# Patient Record
Sex: Male | Born: 1965 | Race: White | Hispanic: No | Marital: Married | Smoking: Never smoker
Health system: Southern US, Community
[De-identification: ages and names within clinical notes are randomized; demographics above are authoritative.]

## PROBLEM LIST (undated history)

## (undated) DIAGNOSIS — I10 Essential (primary) hypertension: Secondary | ICD-10-CM

## (undated) DIAGNOSIS — F319 Bipolar disorder, unspecified: Secondary | ICD-10-CM

## (undated) HISTORY — PX: TYMPANOSTOMY TUBE PLACEMENT: SHX32

---

## 2009-09-23 ENCOUNTER — Emergency Department (HOSPITAL_COMMUNITY): Admission: EM | Admit: 2009-09-23 | Discharge: 2009-09-23 | Payer: Self-pay | Admitting: Family Medicine

## 2010-07-23 ENCOUNTER — Encounter: Payer: Self-pay | Admitting: Gastroenterology

## 2014-01-14 ENCOUNTER — Encounter (HOSPITAL_COMMUNITY): Payer: Self-pay | Admitting: Emergency Medicine

## 2014-01-14 ENCOUNTER — Emergency Department (HOSPITAL_COMMUNITY)
Admission: EM | Admit: 2014-01-14 | Discharge: 2014-01-14 | Disposition: A | Discharge status: Home / Self Care | Payer: PRIVATE HEALTH INSURANCE | Attending: Emergency Medicine | Admitting: Emergency Medicine

## 2014-01-14 DIAGNOSIS — Y92410 Unspecified street and highway as the place of occurrence of the external cause: Secondary | ICD-10-CM | POA: Insufficient documentation

## 2014-01-14 DIAGNOSIS — S51809A Unspecified open wound of unspecified forearm, initial encounter: Secondary | ICD-10-CM | POA: Insufficient documentation

## 2014-01-14 DIAGNOSIS — Y9389 Activity, other specified: Secondary | ICD-10-CM | POA: Insufficient documentation

## 2014-01-14 DIAGNOSIS — S61409A Unspecified open wound of unspecified hand, initial encounter: Secondary | ICD-10-CM | POA: Diagnosis not present

## 2014-01-14 DIAGNOSIS — I10 Essential (primary) hypertension: Secondary | ICD-10-CM | POA: Insufficient documentation

## 2014-01-14 DIAGNOSIS — IMO0002 Reserved for concepts with insufficient information to code with codable children: Secondary | ICD-10-CM | POA: Diagnosis present

## 2014-01-14 DIAGNOSIS — Z8659 Personal history of other mental and behavioral disorders: Secondary | ICD-10-CM | POA: Diagnosis not present

## 2014-01-14 DIAGNOSIS — T07XXXA Unspecified multiple injuries, initial encounter: Secondary | ICD-10-CM

## 2014-01-14 HISTORY — DX: Bipolar disorder, unspecified: F31.9

## 2014-01-14 HISTORY — DX: Essential (primary) hypertension: I10
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
## 2014-01-15 NOTE — ED Provider Notes (Signed)
Medical screening examination/treatment/procedure(s) were performed by non-physician practitioner and as supervising physician I was immediately available for consultation/collaboration.   EKG Interpretation None       Delando Satter, MD 01/15/14 1451 

## 2019-12-24 ENCOUNTER — Emergency Department (HOSPITAL_COMMUNITY)
Admission: EM | Admit: 2019-12-24 | Discharge: 2019-12-25 | Disposition: A | Discharge status: Home / Self Care | Payer: BC Managed Care – PPO | Attending: Emergency Medicine | Admitting: Emergency Medicine

## 2019-12-24 DIAGNOSIS — R569 Unspecified convulsions: Secondary | ICD-10-CM | POA: Insufficient documentation

## 2019-12-24 DIAGNOSIS — I10 Essential (primary) hypertension: Secondary | ICD-10-CM | POA: Insufficient documentation

## 2019-12-24 MED ORDER — LORAZEPAM 2 MG/ML IJ SOLN
INTRAMUSCULAR | Status: AC
  Filled 2019-12-24: qty 1
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
## 2019-12-25 ENCOUNTER — Encounter (HOSPITAL_COMMUNITY): Payer: Self-pay | Admitting: Emergency Medicine

## 2019-12-25 ENCOUNTER — Other Ambulatory Visit: Payer: Self-pay

## 2019-12-25 ENCOUNTER — Emergency Department (HOSPITAL_COMMUNITY): Payer: BC Managed Care – PPO

## 2019-12-25 ENCOUNTER — Observation Stay (HOSPITAL_COMMUNITY)
Admission: EM | Admit: 2019-12-25 | Discharge: 2019-12-31 | Disposition: A | Discharge status: Other Acute Inpatient Hospital | Payer: BC Managed Care – PPO | Attending: Internal Medicine | Admitting: Internal Medicine

## 2019-12-25 ENCOUNTER — Ambulatory Visit (HOSPITAL_COMMUNITY)
Admission: EM | Admit: 2019-12-25 | Discharge: 2019-12-25 | Disposition: A | Discharge status: Other Acute Inpatient Hospital | Payer: BC Managed Care – PPO

## 2019-12-25 DIAGNOSIS — F309 Manic episode, unspecified: Secondary | ICD-10-CM | POA: Diagnosis not present

## 2019-12-25 DIAGNOSIS — Z20822 Contact with and (suspected) exposure to covid-19: Secondary | ICD-10-CM | POA: Diagnosis not present

## 2019-12-25 DIAGNOSIS — R569 Unspecified convulsions: Secondary | ICD-10-CM

## 2019-12-25 DIAGNOSIS — Z880 Allergy status to penicillin: Secondary | ICD-10-CM | POA: Diagnosis not present

## 2019-12-25 DIAGNOSIS — E876 Hypokalemia: Secondary | ICD-10-CM | POA: Diagnosis not present

## 2019-12-25 DIAGNOSIS — F419 Anxiety disorder, unspecified: Secondary | ICD-10-CM | POA: Diagnosis not present

## 2019-12-25 DIAGNOSIS — R52 Pain, unspecified: Secondary | ICD-10-CM

## 2019-12-25 DIAGNOSIS — Z79899 Other long term (current) drug therapy: Secondary | ICD-10-CM | POA: Insufficient documentation

## 2019-12-25 DIAGNOSIS — F445 Conversion disorder with seizures or convulsions: Secondary | ICD-10-CM | POA: Diagnosis not present

## 2019-12-25 DIAGNOSIS — Z882 Allergy status to sulfonamides status: Secondary | ICD-10-CM | POA: Diagnosis not present

## 2019-12-25 DIAGNOSIS — I119 Hypertensive heart disease without heart failure: Secondary | ICD-10-CM | POA: Diagnosis not present

## 2019-12-25 DIAGNOSIS — R55 Syncope and collapse: Principal | ICD-10-CM | POA: Diagnosis present

## 2019-12-25 DIAGNOSIS — F3112 Bipolar disorder, current episode manic without psychotic features, moderate: Secondary | ICD-10-CM | POA: Diagnosis not present

## 2019-12-25 LAB — CBC WITH DIFFERENTIAL/PLATELET
Abs Immature Granulocytes: 0.02 10*3/uL (ref 0.00–0.07)
Abs Immature Granulocytes: 0.03 10*3/uL (ref 0.00–0.07)
Basophils Absolute: 0 10*3/uL (ref 0.0–0.1)
Basophils Absolute: 0 10*3/uL (ref 0.0–0.1)
Basophils Relative: 0 %
Basophils Relative: 1 %
Eosinophils Absolute: 0 10*3/uL (ref 0.0–0.5)
Eosinophils Absolute: 0 10*3/uL (ref 0.0–0.5)
Eosinophils Relative: 0 %
Eosinophils Relative: 0 %
HCT: 41.9 % (ref 39.0–52.0)
HCT: 50.1 % (ref 39.0–52.0)
Hemoglobin: 14.1 g/dL (ref 13.0–17.0)
Hemoglobin: 16.1 g/dL (ref 13.0–17.0)
Immature Granulocytes: 0 %
Immature Granulocytes: 0 %
Lymphocytes Relative: 14 %
Lymphocytes Relative: 40 %
Lymphs Abs: 1.1 10*3/uL (ref 0.7–4.0)
Lymphs Abs: 3 10*3/uL (ref 0.7–4.0)
MCH: 30.2 pg (ref 26.0–34.0)
MCH: 30.3 pg (ref 26.0–34.0)
MCHC: 32.1 g/dL (ref 30.0–36.0)
MCHC: 33.7 g/dL (ref 30.0–36.0)
MCV: 89.7 fL (ref 80.0–100.0)
MCV: 94.2 fL (ref 80.0–100.0)
Monocytes Absolute: 0.5 10*3/uL (ref 0.1–1.0)
Monocytes Absolute: 0.6 10*3/uL (ref 0.1–1.0)
Monocytes Relative: 6 %
Monocytes Relative: 8 %
Neutro Abs: 3.9 10*3/uL (ref 1.7–7.7)
Neutro Abs: 6.2 10*3/uL (ref 1.7–7.7)
Neutrophils Relative %: 51 %
Neutrophils Relative %: 80 %
Platelets: 211 10*3/uL (ref 150–400)
Platelets: 291 10*3/uL (ref 150–400)
RBC: 4.67 MIL/uL (ref 4.22–5.81)
RBC: 5.32 MIL/uL (ref 4.22–5.81)
RDW: 13.4 % (ref 11.5–15.5)
RDW: 13.4 % (ref 11.5–15.5)
WBC: 7.6 10*3/uL (ref 4.0–10.5)
WBC: 7.9 10*3/uL (ref 4.0–10.5)
nRBC: 0 % (ref 0.0–0.2)
nRBC: 0 % (ref 0.0–0.2)

## 2019-12-25 LAB — COMPREHENSIVE METABOLIC PANEL
ALT: 22 U/L (ref 0–44)
ALT: 28 U/L (ref 0–44)
AST: 28 U/L (ref 15–41)
AST: 52 U/L — ABNORMAL HIGH (ref 15–41)
Albumin: 3.7 g/dL (ref 3.5–5.0)
Albumin: 4.4 g/dL (ref 3.5–5.0)
Alkaline Phosphatase: 40 U/L (ref 38–126)
Alkaline Phosphatase: 49 U/L (ref 38–126)
Anion gap: 12 (ref 5–15)
Anion gap: 26 — ABNORMAL HIGH (ref 5–15)
BUN: 22 mg/dL — ABNORMAL HIGH (ref 6–20)
BUN: 26 mg/dL — ABNORMAL HIGH (ref 6–20)
CO2: 10 mmol/L — ABNORMAL LOW (ref 22–32)
CO2: 22 mmol/L (ref 22–32)
Calcium: 10.3 mg/dL (ref 8.9–10.3)
Calcium: 9.2 mg/dL (ref 8.9–10.3)
Chloride: 103 mmol/L (ref 98–111)
Chloride: 108 mmol/L (ref 98–111)
Creatinine, Ser: 1.1 mg/dL (ref 0.61–1.24)
Creatinine, Ser: 1.56 mg/dL — ABNORMAL HIGH (ref 0.61–1.24)
GFR calc Af Amer: 59 mL/min — ABNORMAL LOW (ref >60)
GFR calc Af Amer: >60 mL/min (ref >60)
GFR calc non Af Amer: 51 mL/min — ABNORMAL LOW (ref >60)
GFR calc non Af Amer: >60 mL/min (ref >60)
Glucose, Bld: 112 mg/dL — ABNORMAL HIGH (ref 70–99)
Glucose, Bld: 164 mg/dL — ABNORMAL HIGH (ref 70–99)
Potassium: 3.4 mmol/L — ABNORMAL LOW (ref 3.5–5.1)
Potassium: 3.9 mmol/L (ref 3.5–5.1)
Sodium: 137 mmol/L (ref 135–145)
Sodium: 144 mmol/L (ref 135–145)
Total Bilirubin: 0.8 mg/dL (ref 0.3–1.2)
Total Bilirubin: 1.4 mg/dL — ABNORMAL HIGH (ref 0.3–1.2)
Total Protein: 6.2 g/dL — ABNORMAL LOW (ref 6.5–8.1)
Total Protein: 7.1 g/dL (ref 6.5–8.1)

## 2019-12-25 LAB — RAPID URINE DRUG SCREEN, HOSP PERFORMED
Amphetamines: NOT DETECTED
Barbiturates: NOT DETECTED
Benzodiazepines: NOT DETECTED
Cocaine: NOT DETECTED
Opiates: NOT DETECTED
Tetrahydrocannabinol: NOT DETECTED

## 2019-12-25 LAB — BASIC METABOLIC PANEL
Anion gap: 12 (ref 5–15)
BUN: 20 mg/dL (ref 6–20)
CO2: 21 mmol/L — ABNORMAL LOW (ref 22–32)
Calcium: 9.4 mg/dL (ref 8.9–10.3)
Chloride: 105 mmol/L (ref 98–111)
Creatinine, Ser: 1.12 mg/dL (ref 0.61–1.24)
GFR calc Af Amer: >60 mL/min (ref >60)
GFR calc non Af Amer: >60 mL/min (ref >60)
Glucose, Bld: 110 mg/dL — ABNORMAL HIGH (ref 70–99)
Potassium: 3.3 mmol/L — ABNORMAL LOW (ref 3.5–5.1)
Sodium: 138 mmol/L (ref 135–145)

## 2019-12-25 LAB — VALPROIC ACID LEVEL
Valproic Acid Lvl: 59 ug/mL (ref 50.0–100.0)
Valproic Acid Lvl: 95 ug/mL (ref 50.0–100.0)

## 2019-12-25 LAB — AMMONIA: Ammonia: 15 umol/L (ref 9–35)

## 2019-12-25 LAB — MAGNESIUM: Magnesium: 2.1 mg/dL (ref 1.7–2.4)

## 2019-12-25 LAB — CBG MONITORING, ED: Glucose-Capillary: 104 mg/dL — ABNORMAL HIGH (ref 70–99)

## 2019-12-25 LAB — ETHANOL: Alcohol, Ethyl (B): <10 mg/dL (ref <10)

## 2019-12-25 LAB — SARS CORONAVIRUS 2 BY RT PCR (HOSPITAL ORDER, PERFORMED IN ~~LOC~~ HOSPITAL LAB): SARS Coronavirus 2: NEGATIVE

## 2019-12-25 MED ORDER — LIDOCAINE VISCOUS HCL 2 % MT SOLN
15.0000 mL | Freq: Once | OROMUCOSAL | Status: DC
  Filled 2019-12-25: qty 15

## 2019-12-25 MED ORDER — POTASSIUM CHLORIDE CRYS ER 20 MEQ PO TBCR
40.0000 meq | EXTENDED_RELEASE_TABLET | Freq: Once | ORAL | Status: AC
  Administered 2019-12-25: 40 meq via ORAL
  Filled 2019-12-25: qty 2

## 2019-12-25 MED ORDER — LABETALOL HCL 5 MG/ML IV SOLN: 10.0000 mg | Freq: Four times a day (QID) | INTRAVENOUS | Status: DC | PRN

## 2019-12-25 MED ORDER — ESCITALOPRAM OXALATE 10 MG PO TABS
10.0000 mg | ORAL_TABLET | Freq: Every day | ORAL | Status: DC
  Administered 2019-12-26 – 2019-12-31 (×6): 10 mg via ORAL
  Filled 2019-12-25 (×6): qty 1

## 2019-12-25 MED ORDER — DIVALPROEX SODIUM ER 500 MG PO TB24
1000.0000 mg | ORAL_TABLET | Freq: Every morning | ORAL | Status: DC
  Administered 2019-12-26 – 2019-12-31 (×6): 1000 mg via ORAL
  Filled 2019-12-25 (×6): qty 2

## 2019-12-25 MED ORDER — ENOXAPARIN SODIUM 40 MG/0.4ML ~~LOC~~ SOLN
40.0000 mg | SUBCUTANEOUS | Status: DC
  Administered 2019-12-25 – 2019-12-27 (×3): 40 mg via SUBCUTANEOUS
  Filled 2019-12-25 (×5): qty 0.4

## 2019-12-25 MED ORDER — ACETAMINOPHEN 325 MG PO TABS: 650.0000 mg | ORAL_TABLET | Freq: Four times a day (QID) | ORAL | Status: DC | PRN

## 2019-12-25 MED ORDER — ACETAMINOPHEN 650 MG RE SUPP: 650.0000 mg | Freq: Four times a day (QID) | RECTAL | Status: DC | PRN

## 2019-12-25 MED ORDER — LABETALOL HCL 5 MG/ML IV SOLN
5.0000 mg | Freq: Once | INTRAVENOUS | Status: AC
  Administered 2019-12-25: 5 mg via INTRAVENOUS
  Filled 2019-12-25: qty 4

## 2019-12-25 NOTE — ED Notes (Signed)
Pt requesting oral Lidocaine & PRN Melatonin. Will page MD for same.

## 2019-12-25 NOTE — ED Notes (Signed)
Pt coming by EMS after having seizure this evening that was witnessed by wife. EMS stated pt is bipolar and is on Depakote. Wife noticed he was having manic behavior and asked if he had been taking his meds. Pt told her he had been out of Depakote for about 3 weeks. Wife took him on the 23rd to have prescription filled and pt took one dose this AM. Pt hit head when he fell from seizure

## 2019-12-25 NOTE — Procedures (Signed)
Patient Name: Jon Ramos  MRN: [id number]  Epilepsy Attending: Charlsie Quest  Referring Physician/Provider: Dr. Georgiana Spinner Aroor Date: 6/25/121 Duration: 23.56 mins  Patient history: 54 year old male with seizure-like episodes.  EEG to assess for seizures.  Level of alertness: awake  AEDs during EEG study: None  Technical aspects: This EEG study was done with scalp electrodes positioned according to the 10-20 International system of electrode placement. Electrical activity was acquired at a sampling rate of 500Hz  and reviewed with a high frequency filter of 70Hz  and a low frequency filter of 1Hz . EEG data were recorded continuously and digitally stored.   Description: The posterior dominant rhythm consists of 8-9 Hz activity of moderate voltage (25-35 uV) seen predominantly in posterior head regions, symmetric and reactive to eye opening and eye closing. Hyperventilation and photic stimulation were not performed.     IMPRESSION: This study is within normal limits. No seizures or epileptiform discharges were seen throughout the recording.  Cono Gebhard 

## 2019-12-25 NOTE — ED Notes (Signed)
MD Maisie Fus returned page, to check pt's chart & order appropriate PRN medications for pt

## 2019-12-25 NOTE — ED Notes (Signed)
Pt voiced concern regarding lovenox shot, wanting to know side effects/adverse effects. This RN explained & listed all known side effects & adverse effects for pt to pt's satisfaction. Pt then requested that lovenox shot not be given for remainder of hospital stay.
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
## 2019-12-25 NOTE — ED Notes (Signed)
Sedrick Tober wife [phone] looking for an update on the pt
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
## 2019-12-25 NOTE — Progress Notes (Signed)
EEG complete - results pending 

## 2019-12-25 NOTE — Discharge Instructions (Signed)
Your depakote level was normal today.  Continue your normal dosing. We recommend you refrain from driving until you are seen by neurologist. I have placed a referral to neurology-- if you do not hear from them in a few days, follow-up regarding appointment. Return here for any new/acute changes.
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
## 2019-12-25 NOTE — ED Notes (Signed)
Patients belongings in locker #31
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
## 2019-12-25 NOTE — ED Notes (Signed)
Patient verbalized understanding of dc instructions, vss, pt taken out via wheelchair. pts wife contacted to pick pt up.
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
## 2019-12-25 NOTE — ED Notes (Signed)
Patient exited assessment room 125. Patient knocked on Continuous assessment room looking for MHT entered hallway to comfort Pt. Patient began to speak about wife being in lobby and needing redirection. Patient began muttering, MHT approached pt. Pt. Hit ground shoulders parallel to floor, no head contact to floor. Patient immediately began seizing, was unresponsive, began to stop breating. MHT hit the panic button and staff arrived at the scene with oxygen and Code cart within 45 seconds.

## 2019-12-25 NOTE — ED Notes (Signed)
Paged admitting to RN per her request about pt requesting some meds

## 2019-12-25 NOTE — ED Triage Notes (Signed)
Patient brought in by [facility] for unwitnessed seizure at [facility] urgent care, patient found on floor post-ictal and with mouth trauma. Patient currently speaking rapidly and changing topics rapidly. Patient alert, oriented, and in no distress at this time.

## 2019-12-25 NOTE — ED Notes (Signed)
Patient took off gown and leads refuses to wear gown or leads.

## 2019-12-26 ENCOUNTER — Observation Stay (HOSPITAL_BASED_OUTPATIENT_CLINIC_OR_DEPARTMENT_OTHER): Payer: BC Managed Care – PPO

## 2019-12-26 DIAGNOSIS — F3112 Bipolar disorder, current episode manic without psychotic features, moderate: Secondary | ICD-10-CM | POA: Diagnosis not present

## 2019-12-26 DIAGNOSIS — R55 Syncope and collapse: Secondary | ICD-10-CM

## 2019-12-26 LAB — CBC
HCT: 41.3 % (ref 39.0–52.0)
Hemoglobin: 13.8 g/dL (ref 13.0–17.0)
MCH: 30.1 pg (ref 26.0–34.0)
MCHC: 33.4 g/dL (ref 30.0–36.0)
MCV: 90 fL (ref 80.0–100.0)
Platelets: 181 10*3/uL (ref 150–400)
RBC: 4.59 MIL/uL (ref 4.22–5.81)
RDW: 13.4 % (ref 11.5–15.5)
WBC: 6.2 10*3/uL (ref 4.0–10.5)
nRBC: 0 % (ref 0.0–0.2)

## 2019-12-26 LAB — COMPREHENSIVE METABOLIC PANEL
ALT: 33 U/L (ref 0–44)
AST: 98 U/L — ABNORMAL HIGH (ref 15–41)
Albumin: 3.8 g/dL (ref 3.5–5.0)
Alkaline Phosphatase: 40 U/L (ref 38–126)
Anion gap: 14 (ref 5–15)
BUN: 14 mg/dL (ref 6–20)
CO2: 22 mmol/L (ref 22–32)
Calcium: 9.3 mg/dL (ref 8.9–10.3)
Chloride: 103 mmol/L (ref 98–111)
Creatinine, Ser: 1.13 mg/dL (ref 0.61–1.24)
GFR calc Af Amer: >60 mL/min (ref >60)
GFR calc non Af Amer: >60 mL/min (ref >60)
Glucose, Bld: 85 mg/dL (ref 70–99)
Potassium: 3.5 mmol/L (ref 3.5–5.1)
Sodium: 139 mmol/L (ref 135–145)
Total Bilirubin: 1.5 mg/dL — ABNORMAL HIGH (ref 0.3–1.2)
Total Protein: 6.4 g/dL — ABNORMAL LOW (ref 6.5–8.1)

## 2019-12-26 LAB — HIV ANTIBODY (ROUTINE TESTING W REFLEX): HIV Screen 4th Generation wRfx: NONREACTIVE

## 2019-12-26 LAB — ECHOCARDIOGRAM COMPLETE
Height: 69 in
Weight: 3460.34 oz

## 2019-12-26 NOTE — Progress Notes (Signed)
  Echocardiogram 2D Echocardiogram has been performed.  Gerda Diss 12/26/2019, 2:43 PM

## 2019-12-26 NOTE — Progress Notes (Signed)
Pt's wife Dois Davenport called expressing concerns with husband returning home. Dois Davenport states that pt blames her for stress which triggers seizure-like episodes. Wife also states that pt's work is  another trigger and admits her concerns regarding discharging home as a safe option for the patient at this time. Wife inquiring about possibilities of inpatient psychiatric therapy. This RN stated that I would forward her concerns to the providers and ask them to contact her directly.   Dois Davenport    Home: [phone]   Cell: [phone]
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
## 2019-12-26 NOTE — Consult Note (Signed)
[facility] Face-to-Face Psychiatry Consult   Reason for Consult:  Mania  Referring Physician:  Dr Macky Lower Patient Identification: Jon Ramos MRN:  [id number] Principal Diagnosis: Syncope and collapse Diagnosis:  Principal Problem:   Syncope and collapse Active Problems:   Bipolar 1 disorder with moderate mania (HCC)  Total Time spent with patient: 1 hour  Subjective:   Jon Ramos is a 54 y.o. male patient admitted with pseudoseizures.  Patient seen and evaluated in person.  He was lying on his bed with constant hand movements in an anxious manner, leg shaking in anxious manner.  Reports he has not been sleeping well which he contributes to his recent issues along with stress within his marriage.  His wife reports he has increased stress at work as well.  Client rambles on assessment with some tangential thought processes.  Perseverates about his in-laws and their health issues as a moved in with him about a year ago.  He reports he has never had seizures before a ~last couple of days which she contributes to stress.  Notes report that he blames his wife.  He initially went to the emergency department and was cleared from his first seizure.  Then his wife took him to the [facility] for psychiatric evaluation.  He appeared to be having a seizure at the facility and was sent to the ER.  He was admitted for evaluation and cleared by neurology.  Discussed clients symptoms the hypomania, inability to sleep, increased stressors, and other concerns triggering his recent seizure activities.  Explained to the patient that his wife was concerned about him along with this provider.  Discussed the need to go to psychiatric inpatient hospitalist to continue to monitor his progress as he is reporting sleep improvement last night.  Emphasized the need for him to process his stress in a healthy way to prevent seizures the potentially could cause head injuries or other physical trauma.  Also explained to him that his  medications need to be monitored as he has not been on Depakote for "weeks".  He did not seem to be favorable but no objections except he wanted to speak to his wife.  Tangential with pressured speech at times and obviously anxious with outward signs.  HPI per MD:   Patient was in the ed 12/24/19 for seizure that was witnessed by his wife. Patient in on depakote for bipolar one. He stated he had been off of depakote for 3 weeks, but just yesterday (6/24) he started back taking his depakote. Patient wad discharged to go to behavioral health for evaluation. While at [facility] he had an unwitnessed seizure. He was found on the floor post-ictal with a tongue bite. Per patient he has been having  Several "small seizures" recently. He can tell when he is about to have one.  These usually happen when he gets agitated and stressed. He gets really emotional and tearful, he will start shaking his arms, but he is able to stop a full blown seizure from happening by walking away and taking deep breaths. This way it does not progress to where he passes out. He does not remember the two episodes where he passed out. He said he went to [facility] was doing intake paper work and then woke up in an ambulance. Attempted to call wife  For description of the episode but she did not answer.  Patient states his mind has been really busy and he has been having trouble/ not sleeping for the past 8 days. Denies drug  use. Endorses smokeless tobacco and drinking 2 beers per day.  Past Psychiatric History: bipolar d/o  Risk to Self:  yes Risk to Others:  none Prior Inpatient Therapy:  yes Prior Outpatient Therapy:  yes  Past Medical History:  Past Medical History:  Diagnosis Date  . Bipolar 1 disorder ([place])   . Hypertension    History reviewed. No pertinent surgical history. Family History: No family history on file. Family Psychiatric  History: none Social History:  Social History   Substance and Sexual Activity  Alcohol Use No      Social History   Substance and Sexual Activity  Drug Use No    Social History   Socioeconomic History  . Marital status: Married    Spouse name: Not on file  . Number of children: Not on file  . Years of education: Not on file  . Highest education level: Not on file  Occupational History  . Not on file  Tobacco Use  . Smoking status: Never Smoker  Substance and Sexual Activity  . Alcohol use: No  . Drug use: No  . Sexual activity: Not on file  Other Topics Concern  . Not on file  Social History Narrative  . Not on file   Social Determinants of Health   Financial Resource Strain:   . Difficulty of Paying Living Expenses:   Food Insecurity:   . Worried About Charity fundraiser in the Last Year:   . Arboriculturist in the Last Year:   Transportation Needs:   . Film/video editor (Medical):   Marland Kitchen Lack of Transportation (Non-Medical):   Physical Activity:   . Days of Exercise per Week:   . Minutes of Exercise per Session:   Stress:   . Feeling of Stress :   Social Connections:   . Frequency of Communication with Friends and Family:   . Frequency of Social Gatherings with Friends and Family:   . Attends Religious Services:   . Active Member of Clubs or Organizations:   . Attends Archivist Meetings:   Marland Kitchen Marital Status:    Additional Social History:    Allergies:   Allergies  Allergen Reactions  . Penicillins Hives  . Sulfa Antibiotics Hives    Labs:  Results for orders placed or performed during the hospital encounter of 12/25/19 (from the past 48 hour(s))  Basic metabolic panel     Status: Abnormal   Collection Time: 12/25/19  1:49 PM  Result Value Ref Range   Sodium 138 135 - 145 mmol/L   Potassium 3.3 (L) 3.5 - 5.1 mmol/L   Chloride 105 98 - 111 mmol/L   CO2 21 (L) 22 - 32 mmol/L   Glucose, Bld 110 (H) 70 - 99 mg/dL    Comment: Glucose reference range applies only to samples taken after fasting for at least 8 hours.   BUN 20 6 - 20  mg/dL   Creatinine, Ser 1.12 0.61 - 1.24 mg/dL   Calcium 9.4 8.9 - 10.3 mg/dL   GFR calc non Af Amer >60 >60 mL/min   GFR calc Af Amer >60 >60 mL/min   Anion gap 12 5 - 15    Comment: Performed at [facility] [address], [place], [place] [address]  CBC WITH DIFFERENTIAL     Status: None   Collection Time: 12/25/19  1:49 PM  Result Value Ref Range   WBC 7.9 4.0 - 10.5 K/uL   RBC 4.67  4.22 - 5.81 MIL/uL   Hemoglobin 14.1 13.0 - 17.0 g/dL   HCT 67.8 39 - 52 %   MCV 89.7 80.0 - 100.0 fL   MCH 30.2 26.0 - 34.0 pg   MCHC 33.7 30.0 - 36.0 g/dL   RDW 93.8 10.1 - 75.1 %   Platelets 211 150 - 400 K/uL   nRBC 0.0 0.0 - 0.2 %   Neutrophils Relative % 80 %   Neutro Abs 6.2 1.7 - 7.7 K/uL   Lymphocytes Relative 14 %   Lymphs Abs 1.1 0.7 - 4.0 K/uL   Monocytes Relative 6 %   Monocytes Absolute 0.5 0 - 1 K/uL   Eosinophils Relative 0 %   Eosinophils Absolute 0.0 0 - 0 K/uL   Basophils Relative 0 %   Basophils Absolute 0.0 0 - 0 K/uL   Immature Granulocytes 0 %   Abs Immature Granulocytes 0.03 0.00 - 0.07 K/uL    Comment: Performed at [facility], [address] [place], [place] [address]  Valproic acid level     Status: None   Collection Time: 12/25/19  1:49 PM  Result Value Ref Range   Valproic Acid Lvl 95 50.0 - 100.0 ug/mL    Comment: Performed at [facility], [address] [place], [place] [address]  Magnesium     Status: None   Collection Time: 12/25/19  1:49 PM  Result Value Ref Range   Magnesium 2.1 1.7 - 2.4 mg/dL    Comment: Performed at [facility], [address], [place], [place] [address]  CBG monitoring, ED     Status: Abnormal   Collection Time: 12/25/19  2:22 PM  Result Value Ref Range   Glucose-Capillary 104 (H) 70 - 99 mg/dL    Comment: Glucose reference range applies only to samples taken after fasting for at least 8 hours.   Comment 1 Notify RN    Comment 2 Document in Chart   Ammonia     Status: None   Collection Time:  12/25/19  6:17 PM  Result Value Ref Range   Ammonia 15 9 - 35 umol/L    Comment: Performed at [facility], [address] [place], [place] [address]  SARS Coronavirus 2 by RT PCR (hospital order, performed in [facility] hospital lab) Nasopharyngeal Nasopharyngeal Swab     Status: None   Collection Time: 12/25/19  8:52 PM   Specimen: Nasopharyngeal Swab  Result Value Ref Range   SARS Coronavirus 2 NEGATIVE NEGATIVE    Comment: (NOTE) SARS-CoV-2 target nucleic acids are NOT DETECTED.  The SARS-CoV-2 RNA is generally detectable in upper and lower respiratory specimens during the acute phase of infection. The lowest concentration of SARS-CoV-2 viral copies this assay can detect is 250 copies / mL. A negative result does not preclude SARS-CoV-2 infection and should not be used as the sole basis for treatment or other patient management decisions.  A negative result may occur with improper specimen collection / handling, submission of specimen other than nasopharyngeal swab, presence of viral mutation(s) within the areas targeted by this assay, and inadequate number of viral copies (<250 copies / mL). A negative result must be combined with clinical observations, patient history, and epidemiological information.  Fact Sheet for Patients:   BoilerBrush.com.cy  Fact Sheet for Healthcare Providers: https://pope.com/  This test is not yet approved or  cleared by the [place] FDA and has been authorized for detection and/or diagnosis of SARS-CoV-2  by FDA under an Emergency Use Authorization (EUA).  This EUA will remain in effect (meaning this test can be used) for the duration of the COVID-19 declaration under Section 564(b)(1) of the Act, 21 U.S.C. section 360bbb-3(b)(1), unless the authorization is terminated or revoked sooner.  Performed at [facility], [address] [place], [place] [address]   Comprehensive  metabolic panel     Status: Abnormal   Collection Time: 12/26/19  4:56 AM  Result Value Ref Range   Sodium 139 135 - 145 mmol/L   Potassium 3.5 3.5 - 5.1 mmol/L   Chloride 103 98 - 111 mmol/L   CO2 22 22 - 32 mmol/L   Glucose, Bld 85 70 - 99 mg/dL    Comment: Glucose reference range applies only to samples taken after fasting for at least 8 hours.   BUN 14 6 - 20 mg/dL   Creatinine, Ser 4.33 0.61 - 1.24 mg/dL   Calcium 9.3 8.9 - 29.5 mg/dL   Total Protein 6.4 (L) 6.5 - 8.1 g/dL   Albumin 3.8 3.5 - 5.0 g/dL   AST 98 (H) 15 - 41 U/L   ALT 33 0 - 44 U/L   Alkaline Phosphatase 40 38 - 126 U/L   Total Bilirubin 1.5 (H) 0.3 - 1.2 mg/dL   GFR calc non Af Amer >60 >60 mL/min   GFR calc Af Amer >60 >60 mL/min   Anion gap 14 5 - 15    Comment: Performed at [facility], [address], [place], [place] [address]  CBC     Status: None   Collection Time: 12/26/19  4:56 AM  Result Value Ref Range   WBC 6.2 4.0 - 10.5 K/uL   RBC 4.59 4.22 - 5.81 MIL/uL   Hemoglobin 13.8 13.0 - 17.0 g/dL   HCT 66.0 39 - 52 %   MCV 90.0 80.0 - 100.0 fL   MCH 30.1 26.0 - 34.0 pg   MCHC 33.4 30.0 - 36.0 g/dL   RDW 63.0 16.0 - 10.9 %   Platelets 181 150 - 400 K/uL   nRBC 0.0 0.0 - 0.2 %    Comment: Performed at [facility], [address], [place], [place] [address]  HIV Antibody (routine testing w rflx)     Status: None   Collection Time: 12/26/19  4:56 AM  Result Value Ref Range   HIV Screen 4th Generation wRfx Non Reactive Non Reactive    Comment: Performed at [facility], [address], [place], [place] [address]    Current Facility-Administered Medications  Medication Dose Route Frequency Provider Last Rate Last Admin  . acetaminophen (TYLENOL) tablet 650 mg  650 mg Oral Q6H PRN Matcha, Anupama, MD       Or  . acetaminophen (TYLENOL) suppository 650 mg  650 mg Rectal Q6H PRN Matcha, Anupama, MD      . divalproex (DEPAKOTE ER) 24 hr tablet 1,000 mg  1,000 mg Oral q morning - 10a  Matcha, Anupama, MD   1,000 mg at 12/26/19 1003  . enoxaparin (LOVENOX) injection 40 mg  40 mg Subcutaneous Q24H Matcha, Anupama, MD   40 mg at 12/25/19 2037  . escitalopram (LEXAPRO) tablet 10 mg  10 mg Oral Daily Matcha, Anupama, MD   10 mg at 12/26/19 1004  . labetalol (NORMODYNE) injection 10 mg  10 mg Intravenous Q6H PRN Matcha, Anupama, MD      . lidocaine (XYLOCAINE) 2 % viscous mouth  solution 15 mL  15 mL Mouth/Throat Once Lurline Delhomas, Sara-Maiz A, MD        Musculoskeletal: Strength & Muscle Tone: increased Gait & Station: normal Patient leans: N/A  Psychiatric Specialty Exam: Physical Exam Vitals and nursing note reviewed.  Constitutional:      Appearance: Normal appearance.  HENT:     Head: Normocephalic.     Nose: Nose normal.  Pulmonary:     Effort: Pulmonary effort is normal.  Musculoskeletal:        General: Normal range of motion.  Neurological:     General: No focal deficit present.     Mental Status: He is alert and oriented to person, place, and time.  Psychiatric:        Attention and Perception: He is inattentive.        Mood and Affect: Mood is anxious.        Speech: Speech is tangential.        Behavior: Behavior is cooperative.        Thought Content: Thought content is paranoid and delusional.        Cognition and Memory: He exhibits impaired recent memory.        Judgment: Judgment is impulsive.     Review of Systems  Psychiatric/Behavioral: Positive for sleep disturbance. The patient is nervous/anxious and is hyperactive.   All other systems reviewed and are negative.   Blood pressure (!) 145/89, pulse 86, temperature 98.4 F (36.9 C), resp. rate 18, height 5\' 9"  (1.753 m), weight 98.1 kg, SpO2 99 %.Body mass index is 31.94 kg/m.  General Appearance: Casual  Eye Contact:  Fair  Speech:  Pressured at times  Volume:  Normal  Mood:  Anxious  Affect:  Congruent  Thought Process:  Descriptions of Associations: Tangential at times  Orientation:   Full (Time, Place, and Person)  Thought Content:  Delusions and Paranoid Ideation  Suicidal Thoughts:  No  Homicidal Thoughts:  No  Memory:  Immediate;   Fair Recent;   Fair Remote;   Fair  Judgement:  Impaired  Insight:  Lacking  Psychomotor Activity:  Increased  Concentration:  Concentration: Poor and Attention Span: Poor  Recall:  FiservFair  Fund of Knowledge:  Fair  Language:  Good  Akathisia:  No  Handed:  Left  AIMS (if indicated):     Assets:  Housing Leisure Time Physical Health Resilience Social Support Vocational/Educational  ADL's:  Intact  Cognition:  Impaired,  Mild  Sleep:        Treatment Plan Summary: Bipolar affective disorder, mania, moderate: -Recommend Depakote 1000 mg Daily to BID  [facility]  Depression: -continue Lexapro 10 mg daily  Disposition: Recommend psychiatric Inpatient admission when medically cleared.  Nanine MeansJamison Kyria Bumgardner, NP 12/26/2019 2:49 PM

## 2019-12-26 NOTE — Progress Notes (Signed)
Carotid artery duplex has been completed. Preliminary results can be found in CV Proc through chart review.   12/26/19 11:03 AM Olen Cordial RVT

## 2019-12-26 NOTE — Progress Notes (Signed)
Arrived in room shortly after patient arrived from the emergency department to find patient alone and walking around room.  This RN then advised the patient not to walk around unassisted due to him having possibly had seizures, causing him to be admitted to the hospital.  The patient insisted there is nothing wrong with him despite what the doctors say or what events occurred prior to admission.  Despite several attempts to encourage him to adhere to safety plan, the patient stated he was not going to.  The patient was found to be oriented to self, time, place, and situation.  He was left in bed with call bell and phone in reach; bed alarm is on.

## 2019-12-27 ENCOUNTER — Observation Stay (HOSPITAL_COMMUNITY): Payer: BC Managed Care – PPO

## 2019-12-27 DIAGNOSIS — F3112 Bipolar disorder, current episode manic without psychotic features, moderate: Secondary | ICD-10-CM | POA: Diagnosis not present

## 2019-12-27 LAB — CBC
HCT: 41 % (ref 39.0–52.0)
Hemoglobin: 13.7 g/dL (ref 13.0–17.0)
MCH: 29.8 pg (ref 26.0–34.0)
MCHC: 33.4 g/dL (ref 30.0–36.0)
MCV: 89.1 fL (ref 80.0–100.0)
Platelets: 179 10*3/uL (ref 150–400)
RBC: 4.6 MIL/uL (ref 4.22–5.81)
RDW: 13.4 % (ref 11.5–15.5)
WBC: 4.6 10*3/uL (ref 4.0–10.5)
nRBC: 0 % (ref 0.0–0.2)

## 2019-12-27 LAB — BASIC METABOLIC PANEL
Anion gap: 12 (ref 5–15)
BUN: 15 mg/dL (ref 6–20)
CO2: 22 mmol/L (ref 22–32)
Calcium: 9 mg/dL (ref 8.9–10.3)
Chloride: 102 mmol/L (ref 98–111)
Creatinine, Ser: 1.05 mg/dL (ref 0.61–1.24)
GFR calc Af Amer: >60 mL/min (ref >60)
GFR calc non Af Amer: >60 mL/min (ref >60)
Glucose, Bld: 90 mg/dL (ref 70–99)
Potassium: 3.6 mmol/L (ref 3.5–5.1)
Sodium: 136 mmol/L (ref 135–145)

## 2019-12-27 MED ORDER — POTASSIUM CHLORIDE CRYS ER 20 MEQ PO TBCR
20.0000 meq | EXTENDED_RELEASE_TABLET | Freq: Once | ORAL | Status: AC
  Administered 2019-12-27: 20 meq via ORAL
  Filled 2019-12-27: qty 1

## 2019-12-27 MED ORDER — AMLODIPINE BESYLATE 5 MG PO TABS
5.0000 mg | ORAL_TABLET | Freq: Every day | ORAL | Status: DC
  Administered 2019-12-27 – 2019-12-29 (×3): 5 mg via ORAL
  Filled 2019-12-27 (×3): qty 1

## 2019-12-27 NOTE — Progress Notes (Signed)
PROGRESS NOTE    Jon Ramos  WUJ:[id number] DOB: 1965/10/08 DOA: 12/25/2019 PCP: Patient, No Pcp Per    Chief Complaint  Patient presents with  . Seizures    Brief Narrative:  Jon Ramos is a 54 y.o. male with medical history significant of bipolar disorder and hypertension presented with complaints of syncopal episode that occurred on the day of admission at 11.30 in morning.   Patient apparently was at his behavioral health psychiatrist office the morning of admission when the syncopal/seizure episode happened. Per the note of his psychiatrist it appears that patient was having convulsive-like activity laying on the ground with both arms in the air. He has no documented history of pseudoseizures or seizures except yesterday when he had a similar episode and was brought to the ER.  He was evaluated in the ED the day prior and discharged with outpatient follow-up.  Sent to ED again because of the second episode.He denies any prodrome-like symptoms, fevers, chills, chest pain, shortness of breath, nausea, lightheadedness, dizziness. He denies any bowel or bladder incontinence and states he did not feel confused. He denies any headache, vision changes. Denies any focal weakness or numbness.  On further conversation with the patient he says that when he gets emotionally upset he has these episodes.   Assessment & Plan:   Principal Problem:   Syncope and collapse Active Problems:   Bipolar 1 disorder with moderate mania (HCC)  Syncope: -Exact etiology unclear at this time.  There is also some concern for seizures psychogenic nonepileptic spells versus seizure.  Neurology was consulted.  EEG did not show any evidence of seizures. -Orthostatic vitals negative. -2D echo and carotid Doppler to complete the work-up.  Carotid Doppler showing 1-70% stenosis bilaterally without any other acute findings. -EEG did not show seizure activity. -Episodes may be likely related to his  underlying bipolar/psych issues  Bipolar disorder: -Patient noted to have this episode at the psychiatrist office -Per neurology possibility of psychogenic nonepileptic spells. -Consulted psychiatry -Ammonia level normal -Continue home medications.  He previously was off Depakote and now restarted again. On Depakote and Lexapro. - Per Psychiatry needs inpatient psych.  Hypertension: -He is currently not taking any medications for his blood pressure. -Start him on Norvasc.  Continue to monitor blood pressure closely and adjust medications as needed.  Hypokalemia: -Potassium replacement ordered  Right knee pain/swelling: -Patient complaining of right knee swelling.  He says when he had 1 of these "seizure" episodes he probably fell on his knee and now having worsening swelling.  On examination he does not have any erythema, open lesions but does have some effusion. Ordered knee x-ray.   DVT prophylaxis: Subcutaneous Lovenox Code Status: Full Family Communication: Discussed with wife over the phone Disposition:   Status is: Observation for now.  Patient has bipolar with mania.  Since psychiatry recommending inpatient psych admission we may need to consider switching to inpatient.  Dispo: The patient is from: Home              Anticipated d/c is to: Unknown at this time              Patient currently is not medically stable to d/c.    Consultants:   Psychiatry  Procedures:   None   Antimicrobials:   None    Subjective: Blood pressure continues to remain high.  He is complaining of right knee pain and swelling.  Objective: Vitals:   12/26/19 9562 12/26/19 1601 12/26/19 1956 12/27/19 1308  BP: (!) 145/89 (!) 148/99 (!) 145/93 (!) 150/99  Pulse: 86 87  82  Resp: Temp: 98.4 F (36.9 C) 98.6 F (37 C) 98.3 F (36.8 C)   TempSrc:   Oral   SpO2: 99% 99% 100% 98%  Weight:      Height:       No intake or output data in the 24 hours ending 12/27/19  0928 Filed Weights   12/25/19 2340  Weight: 98.1 kg    Examination:  General exam: Appears calm and comfortable  Respiratory system: Clear to auscultation. Respiratory effort normal. Cardiovascular system: S1 & S2 heard, RRR. No murmur. No pedal edema. Gastrointestinal system: Abdomen is nondistended, soft and nontender. No masses felt. Normal bowel sounds heard. Central nervous system: Alert and oriented. No focal neurological deficits. Extremities: Has some swelling, effusion to the right knee, no erythema or tenderness. 5 x 5 power. Skin: No rashes, lesions or ulcers Psychiatry: Bipolar.    Data Reviewed: I have personally reviewed following labs and imaging studies  CBC: Recent Labs  Lab 12/24/19 2340 12/25/19 1349 12/26/19 0456 12/27/19 0243  WBC 7.6 7.9 6.2 4.6  NEUTROABS 3.9 6.2  --   --   HGB 16.1 14.1 13.8 13.7  HCT 50.1 41.9 41.3 41.0  MCV 94.2 89.7 90.0 89.1  PLT 291 211 181 179    Basic Metabolic Panel: Recent Labs  Lab 12/24/19 2340 12/25/19 0428 12/25/19 1349 12/26/19 0456 12/27/19 0243  NA 144 137 138 139 136  K 3.9 3.4* 3.3* 3.5 3.6  CL 108 103 105 103 102  CO2 10* 22 21* 22 22  GLUCOSE 164* 112* 110* 85 90  BUN 26* 22* CREATININE 1.56* 1.10 1.12 1.13 1.05  CALCIUM 10.3 9.2 9.4 9.3 9.0  MG  --   --  2.1  --   --     GFR: Estimated Creatinine Clearance: 96.2 mL/min (by C-G formula based on SCr of 1.05 mg/dL).  Liver Function Tests: Recent Labs  Lab 12/24/19 2340 12/25/19 0428 12/26/19 0456  AST 52* 28 98*  ALT 28 22 33  ALKPHOS 49 40 40  BILITOT 1.4* 0.8 1.5*  PROT 7.1 6.2* 6.4*  ALBUMIN 4.4 3.7 3.8    CBG: Recent Labs  Lab 12/25/19 1422  GLUCAP 104*     Recent Results (from the past 240 hour(s))  SARS Coronavirus 2 by RT PCR (hospital order, performed in [facility] hospital lab) Nasopharyngeal Nasopharyngeal Swab     Status: None   Collection Time: 12/25/19  8:52 PM   Specimen: Nasopharyngeal Swab  Result  Value Ref Range Status   SARS Coronavirus 2 NEGATIVE NEGATIVE Final    Comment: (NOTE) SARS-CoV-2 target nucleic acids are NOT DETECTED.  The SARS-CoV-2 RNA is generally detectable in upper and lower respiratory specimens during the acute phase of infection. The lowest concentration of SARS-CoV-2 viral copies this assay can detect is 250 copies / mL. A negative result does not preclude SARS-CoV-2 infection and should not be used as the sole basis for treatment or other patient management decisions.  A negative result may occur with improper specimen collection / handling, submission of specimen other than nasopharyngeal swab, presence of viral mutation(s) within the areas targeted by this assay, and inadequate number of viral copies (<250 copies / mL). A negative result must be combined with clinical observations, patient history, and epidemiological information.  Fact Sheet for Patients:   BoilerBrush.com.cy  Fact Sheet  for Healthcare Providers: https://pope.com/  This test is not yet approved or  cleared by the [place] and has been authorized for detection and/or diagnosis of SARS-CoV-2 by FDA under an Emergency Use Authorization (EUA).  This EUA will remain in effect (meaning this test can be used) for the duration of the COVID-19 declaration under Section 564(b)(1) of the Act, 21 U.S.C. section 360bbb-3(b)(1), unless the authorization is terminated or revoked sooner.  Performed at [facility], [address] [place], [place] [address]          Radiology Studies: EEG  Result Date: 12/25/2019 Charlsie Quest, MD     12/25/2019  4:53 PM Patient Name: Weiland Tomich MRN: [id number] Epilepsy Attending: Charlsie Quest Referring Physician/Provider: Dr. Georgiana Spinner Aroor Date: 6/25/121 Duration: 23.56 mins Patient history: 54 year old male with seizure-like episodes.  EEG to assess for seizures. Level of alertness:  awake AEDs during EEG study: None Technical aspects: This EEG study was done with scalp electrodes positioned according to the 10-20 International system of electrode placement. Electrical activity was acquired at a sampling rate of 500Hz  and reviewed with a high frequency filter of 70Hz  and a low frequency filter of 1Hz . EEG data were recorded continuously and digitally stored. Description: The posterior dominant rhythm consists of 8-9 Hz activity of moderate voltage (25-35 uV) seen predominantly in posterior head regions, symmetric and reactive to eye opening and eye closing. Hyperventilation and photic stimulation were not performed.   IMPRESSION: This study is within normal limits. No seizures or epileptiform discharges were seen throughout the recording.   DG Chest Portable 1 View  Result Date: 12/25/2019 CLINICAL DATA:  Syncope, chest pain. EXAM: PORTABLE CHEST 1 VIEW COMPARISON:  None. FINDINGS: The heart size and mediastinal contours are within normal limits. Both lungs are clear. No pneumothorax or pleural effusion is noted. The visualized skeletal structures are unremarkable. IMPRESSION: No active disease. Electronically Signed   By: M.D.   On: 12/25/2019 16:51   ECHOCARDIOGRAM COMPLETE  Result Date: 12/26/2019    ECHOCARDIOGRAM REPORT   Patient Name:   RASHAAD HALLSTROM Date of Exam: 12/26/2019 Medical Rec #:  12/28/2019     Height:       69.0 in Accession #:    Kristine Linea    Weight:       216.3 lb Date of Birth:  [birth year]      BSA:          2.136 m Patient Age:    53 years      BP:           145/89 mmHg Patient Gender: M             HR:           86 bpm. Exam Location:  Inpatient Procedure: 2D Echo, Cardiac Doppler and Color Doppler Indications:    Syncope  History:        Patient has no prior history of Echocardiogram examinations.                 Risk Factors:Hypertension.  Sonographer:    [id number] RDCS (AE) Referring Phys: [id number] [facility]  Sonographer  Comments: No subcostal window and suboptimal parasternal window. IMPRESSIONS  1. Left ventricular ejection fraction, by estimation, is 55 to 60%. The left ventricle has normal function. The left ventricle has no regional wall motion abnormalities. There is moderate left ventricular hypertrophy. Left ventricular diastolic parameters were normal.  2. Right ventricular systolic function is normal. The right ventricular size is normal. Tricuspid regurgitation signal is inadequate for assessing PA pressure.  3. Left atrial size was upper normal.  4. The mitral valve is grossly normal. Trivial mitral valve regurgitation.  5. The aortic valve is tricuspid. Aortic valve regurgitation is not visualized. Mild aortic valve sclerosis is present, with no evidence of aortic valve stenosis.  6. Aortic dilatation noted. There is mild dilatation of the aortic root and of the ascending aorta.  7. Unable to estimate CVP. FINDINGS  Left Ventricle: Left ventricular ejection fraction, by estimation, is 55 to 60%. The left ventricle has normal function. The left ventricle has no regional wall motion abnormalities. The left ventricular internal cavity size was normal in size. There is  moderate left ventricular hypertrophy. Left ventricular diastolic parameters were normal. Right Ventricle: The right ventricular size is normal. No increase in right ventricular wall thickness. Right ventricular systolic function is normal. Tricuspid regurgitation signal is inadequate for assessing PA pressure. Left Atrium: Left atrial size was upper normal. Right Atrium: Right atrial size was normal in size. Pericardium: There is no evidence of pericardial effusion. Mitral Valve: The mitral valve is grossly normal. Mild mitral annular calcification. Trivial mitral valve regurgitation. MV peak gradient, 2.3 mmHg. The mean mitral valve gradient is 1.0 mmHg. Tricuspid Valve: The tricuspid valve is grossly normal. Tricuspid valve regurgitation is trivial.  Aortic Valve: The aortic valve is tricuspid. Aortic valve regurgitation is not visualized. Mild aortic valve sclerosis is present, with no evidence of aortic valve stenosis. Mild aortic valve annular calcification. Aortic valve mean gradient measures 5.0  mmHg. Aortic valve peak gradient measures 7.6 mmHg. Aortic valve area, by VTI measures 2.58 cm. Pulmonic Valve: The pulmonic valve was not well visualized. Pulmonic valve regurgitation is not visualized. Aorta: Aortic dilatation noted. There is mild dilatation of the aortic root and of the ascending aorta. Venous: Unable to estimate CVP. The inferior vena cava was not well visualized. IAS/Shunts: The interatrial septum was not well visualized.  LEFT VENTRICLE PLAX 2D LVIDd:         5.10 cm  Diastology LVIDs:         3.80 cm  LV e' lateral:   12.00 cm/s LV PW:         1.20 cm  LV E/e' lateral: 5.5 LV IVS:        1.60 cm  LV e' medial:    9.36 cm/s LVOT diam:     2.10 cm  LV E/e' medial:  7.1 LV SV:         68 LV SV Index:   32 LVOT Area:     3.46 cm  RIGHT VENTRICLE RV Basal diam:  3.20 cm RV S prime:     9.36 cm/s TAPSE (M-mode): 1.9 cm LEFT ATRIUM             Index       RIGHT ATRIUM           Index LA diam:        3.70 cm 1.73 cm/m  RA Area:     14.80 cm LA Vol (A2C):   87.2 ml 40.83 ml/m RA Volume:   34.30 ml  16.06 ml/m LA Vol (A4C):   62.0 ml 29.03 ml/m LA Biplane Vol: 73.2 ml 34.27 ml/m  AORTIC VALVE AV Area (Vmax):    2.26 cm AV Area (Vmean):   2.28 cm AV Area (VTI):     2.58  cm AV Vmax:           138.00 cm/s AV Vmean:          102.000 cm/s AV VTI:            0.264 m AV Peak Grad:      7.6 mmHg AV Mean Grad:      5.0 mmHg LVOT Vmax:         90.10 cm/s LVOT Vmean:        67.100 cm/s LVOT VTI:          0.197 m LVOT/AV VTI ratio: 0.75  AORTA Ao Root diam: 3.80 cm Ao Asc diam:  3.90 cm MITRAL VALVE MV Area (PHT): 4.06 cm    SHUNTS MV Peak grad:  2.3 mmHg    Systemic VTI:  0.20 m MV Mean grad:  1.0 mmHg    Systemic Diam: 2.10 cm MV Vmax:       0.75  m/s MV Vmean:      42.5 cm/s MV Decel Time: 187 msec MV E velocity: 66.40 cm/s MV A velocity: 58.50 cm/s MV E/A ratio:  1.14 Nona Dell MD Electronically signed by Nona Dell MD Signature Date/Time: 12/26/2019/2:47:14 PM    Final    VAS US CAROTID  Result Date: 12/26/2019 Carotid Arterial Duplex Study Indications:       Syncope. Risk Factors:      None. Comparison Study:  No prior studies. Performing Technologist: Chanda Busing RVT  Examination Guidelines: A complete evaluation includes B-mode imaging, spectral Doppler, color Doppler, and power Doppler as needed of all accessible portions of each vessel. Bilateral testing is considered an integral part of a complete examination. Limited examinations for reoccurring indications may be performed as noted.  Right Carotid Findings: +----------+--------+--------+--------+------------------+------------------+           PSV cm/sEDV cm/sStenosisPlaque DescriptionComments           +----------+--------+--------+--------+------------------+------------------+ CCA Prox  91      10                                intimal thickening +----------+--------+--------+--------+------------------+------------------+ CCA Distal86      21                                intimal thickening +----------+--------+--------+--------+------------------+------------------+ ICA Prox  63      21                                intimal thickening +----------+--------+--------+--------+------------------+------------------+ ICA Distal62      29                                                   +----------+--------+--------+--------+------------------+------------------+ ECA       72      14                                                   +----------+--------+--------+--------+------------------+------------------+ +----------+--------+-------+--------+-------------------+           PSV cm/sEDV cmsDescribeArm Pressure (mmHG)  +----------+--------+-------+--------+-------------------+  [phone]                                        +----------+--------+-------+--------+-------------------+ +---------+--------+--+--------+--+---------+ VertebralPSV cm/s51EDV cm/s14Antegrade +---------+--------+--+--------+--+---------+  Left Carotid Findings: +----------+--------+-------+--------+----------------------+------------------+           PSV cm/sEDV    StenosisPlaque Description    Comments                             cm/s                                                    +----------+--------+-------+--------+----------------------+------------------+ CCA Prox  126     29                                   intimal thickening +----------+--------+-------+--------+----------------------+------------------+ CCA Distal94      24                                   intimal thickening +----------+--------+-------+--------+----------------------+------------------+ ICA Prox  62      24             smooth and                                                                heterogenous                             +----------+--------+-------+--------+----------------------+------------------+ ICA Distal69      31                                                      +----------+--------+-------+--------+----------------------+------------------+ ECA       85      9                                                       +----------+--------+-------+--------+----------------------+------------------+ +----------+--------+--------+--------+-------------------+           PSV cm/sEDV cm/sDescribeArm Pressure (mmHG) +----------+--------+--------+--------+-------------------+ [id number]                                         +----------+--------+--------+--------+-------------------+ +---------+--------+--+--------+--+---------+ VertebralPSV cm/s58EDV cm/s22Antegrade  +---------+--------+--+--------+--+---------+   Summary: Right Carotid: Velocities in the right ICA are consistent with a 1-39% stenosis. Left Carotid: Velocities in the left ICA are consistent with a 1-39% stenosis. Vertebrals: Bilateral vertebral arteries demonstrate antegrade flow. *See table(s) above for measurements and observations.  Electronically signed by Waverly Ferrarihristopher Dickson MD on 12/26/2019 at 6:48:58 PM.    Final         Scheduled Meds: . divalproex  1,000 mg Oral q morning - 10a  . enoxaparin (LOVENOX) injection  40 mg Subcutaneous Q24H  . escitalopram  10 mg Oral Daily  . lidocaine  15 mL Mouth/Throat Once  . potassium chloride  20 mEq Oral Once   Continuous Infusions:   LOS: 0 days    Vonzella NippleAnupama Maxon Kresse, MD [facility] Pager on amion   To contact the attending provider between 7A-7P or the covering provider during after hours 7P-7A, please log into the web site [email or web address] and access using universal [place] password for that web site. If you do not have the password, please call the hospital operator.  12/27/2019, 9:28 AM

## 2019-12-27 NOTE — Social Work (Addendum)
CSW acknowledging consult for psych placement. Patient currently is not medically stable, will contact [facility] and additional placements for review and bed availability once medically stable.   Verlon Au, LCSWA Clinical Social Worker

## 2019-12-28 DIAGNOSIS — F3112 Bipolar disorder, current episode manic without psychotic features, moderate: Secondary | ICD-10-CM | POA: Diagnosis not present

## 2019-12-28 LAB — BASIC METABOLIC PANEL
Anion gap: 10 (ref 5–15)
BUN: 11 mg/dL (ref 6–20)
CO2: 24 mmol/L (ref 22–32)
Calcium: 9.2 mg/dL (ref 8.9–10.3)
Chloride: 104 mmol/L (ref 98–111)
Creatinine, Ser: 0.98 mg/dL (ref 0.61–1.24)
GFR calc Af Amer: >60 mL/min (ref >60)
GFR calc non Af Amer: >60 mL/min (ref >60)
Glucose, Bld: 94 mg/dL (ref 70–99)
Potassium: 3.9 mmol/L (ref 3.5–5.1)
Sodium: 138 mmol/L (ref 135–145)

## 2019-12-28 NOTE — Social Work (Signed)
CSW faxed referrals to [facility], [facility], [place], and [facility].  CSW will continue to follow.   Jimmy Picket, Theresia Majors, [place] Clinical Social Worker [phone]

## 2019-12-28 NOTE — Plan of Care (Signed)

## 2019-12-28 NOTE — Progress Notes (Signed)
Tried to find the his parents phone number via google. White pages is blocked. Pt stated that he wanted to talk to parents.

## 2019-12-28 NOTE — Progress Notes (Signed)
PROGRESS NOTE    Jon Ramos  VOH:[id number] DOB: 24-Jul-1965 DOA: 12/25/2019 PCP: Jon Ramos, No Pcp Per    Chief Complaint  Jon Ramos presents with  . Seizures    Brief Narrative:  Jon Ramos is a 54 y.o. male with medical history significant of bipolar disorder and hypertension presented with complaints of syncopal episode that occurred on the day of admission at 11.30 in morning.   Jon Ramos apparently was at his behavioral health psychiatrist office the morning of admission when the syncopal/seizure episode happened. Per the note of his psychiatrist it appears that Jon Ramos was having convulsive-like activity laying on the ground with both arms in the air. He has no documented history of pseudoseizures or seizures except when he had a similar episode and was brought to the ER.  He was evaluated in the ED the day prior and discharged with outpatient follow-up.  Sent to ED again because of the second episode.He denies any prodrome-like symptoms, fevers, chills, chest pain, shortness of breath, nausea, lightheadedness, dizziness. He denies any bowel or bladder incontinence and states he did not feel confused. He denies any headache, vision changes. Denies any focal weakness or numbness.  On further conversation with the Jon Ramos he says that when he gets emotionally upset he has these episodes.   Assessment & Plan:   Principal Problem:   Syncope and collapse Active Problems:   Bipolar 1 disorder with moderate mania (HCC)  Syncope: -Exact etiology unclear at this time. 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  Neurology was consulted.  EEG did not show any evidence of seizures. -Orthostatic vitals negative. -2D echo and carotid Doppler to complete the work-up.  Carotid Doppler showing 1-70% stenosis bilaterally without any other acute findings.  2D echo did not show any acute findings. -EEG did not show seizure activity. -Episodes may be likely related to his underlying  bipolar/psych issues  Bipolar disorder: -Jon Ramos noted to have this episode at the psychiatrist office -Per neurology possibility of psychogenic nonepileptic spells. -Consulted psychiatry -Ammonia level normal -Continue home medications.  He previously was off Depakote and now restarted again. On Depakote and Lexapro. - Per Psychiatry needs inpatient psych.  Hypertension: -Improving with Norvasc.  Continue to monitor blood pressure closely and adjust medications as needed.  Hypokalemia: -Potassium replaced  Right knee pain/swelling: -Jon Ramos complaining of right knee swelling.  He says when he had 1 of these "seizure" episodes he probably fell on his knee and had worsening swelling.  On examination he does not have any erythema, open lesions but does have some effusion.  X-ray of the knee per report large joint effusion.  This morning he says his his knee pain and swelling significantly improved.  He is able to bear weight and ambulate well.   DVT prophylaxis: Subcutaneous Lovenox Code Status: Full Family Communication: Previously talked to wife over the phone.  Unable to reach her today. Disposition:   Jon Ramos has bipolar with mania.  Since psychiatry recommending inpatient psych admission we may need to consider switching to inpatient.  Dispo: The Jon Ramos is from: Home              Anticipated d/c is to: Unknown at this time              Jon Ramos currently is not medically stable to d/c.    Consultants:   Psychiatry  Procedures:   None   Antimicrobials:   None    Subjective: Right knee pain and swelling improved per Jon Ramos.  Objective: Vitals:  12/27/19 2106 12/28/19 0743 12/28/19 1538 12/28/19 1539  BP: (!) 156/98 139/90 (!) 156/109 (!) 156/92  Pulse: 81 83 94 78  Resp:  15 19   Temp: 99 F (37.2 C) 98.4 F (36.9 C) 97.9 F (36.6 C)   TempSrc: Oral     SpO2: 98% 98% 100% 100%  Weight:      Height:        Intake/Output Summary (Last 24 hours) at  12/28/2019 1549 Last data filed at 12/27/2019 2100 Gross per 24 hour  Intake 120 ml  Output --  Net 120 ml   Filed Weights   12/25/19 2340  Weight: 98.1 kg    Examination: General exam: Appears calm and comfortable  Respiratory system: Clear to auscultation. Respiratory effort normal. Cardiovascular system: S1 & S2 heard, RRR. No murmur. No pedal edema. Gastrointestinal system: Abdomen is nondistended, soft and nontender. No masses felt. Normal bowel sounds heard. Central nervous system: Alert and oriented. No focal neurological deficits. Extremities: swelling right knee improved, no erythema or tenderness. 5 x 5 power. Skin: No rashes, lesions or ulcers Psychiatry: Bipolar.   Data Reviewed: I have personally reviewed following labs and imaging studies  CBC: Recent Labs  Lab 12/24/19 2340 12/25/19 1349 12/26/19 0456 12/27/19 0243  WBC 7.6 7.9 6.2 4.6  NEUTROABS 3.9 6.2  --   --   HGB 16.1 14.1 13.8 13.7  HCT 50.1 41.9 41.3 41.0  MCV 94.2 89.7 90.0 89.1  PLT 291 211 181 179    Basic Metabolic Panel: Recent Labs  Lab 12/25/19 0428 12/25/19 1349 12/26/19 0456 12/27/19 0243 12/28/19 0409  NA 137 138 139 136 138  K 3.4* 3.3* 3.5 3.6 3.9  CL 103 105 103 102 104  CO2 22 21* 22 22 24   GLUCOSE 112* 110* 85 90 94  BUN 22* 20 14 15 11   CREATININE 1.10 1.12 1.13 1.05 0.98  CALCIUM 9.2 9.4 9.3 9.0 9.2  MG  --  2.1  --   --   --     GFR: Estimated Creatinine Clearance: 103.1 mL/min (by C-G formula based on SCr of 0.98 mg/dL).  Liver Function Tests: Recent Labs  Lab 12/24/19 2340 12/25/19 0428 12/26/19 0456  AST 52* 28 98*  ALT 28 22 33  ALKPHOS 49 40 40  BILITOT 1.4* 0.8 1.5*  PROT 7.1 6.2* 6.4*  ALBUMIN 4.4 3.7 3.8    CBG: Recent Labs  Lab 12/25/19 1422  GLUCAP 104*     Recent Results (from the past 240 hour(s))  SARS Coronavirus 2 by RT PCR (hospital order, performed in [facility] hospital lab) Nasopharyngeal Nasopharyngeal Swab     Status: None    Collection Time: 12/25/19  8:52 PM   Specimen: Nasopharyngeal Swab  Result Value Ref Range Status   SARS Coronavirus 2 NEGATIVE NEGATIVE Final    Comment: (NOTE) SARS-CoV-2 target nucleic acids are NOT DETECTED.  The SARS-CoV-2 RNA is generally detectable in upper and lower respiratory specimens during the acute phase of infection. The lowest concentration of SARS-CoV-2 viral copies this assay can detect is 250 copies / mL. A negative result does not preclude SARS-CoV-2 infection and should not be used as the sole basis for treatment or other Jon Ramos management decisions.  A negative result may occur with improper specimen collection / handling, submission of specimen other than nasopharyngeal swab, presence of viral mutation(s) within the areas targeted by this assay, and inadequate number of viral copies (<250 copies / mL). A negative result  must be combined with clinical observations, Jon Ramos history, and epidemiological information.  Fact Sheet for Patients:   BoilerBrush.com.cy  Fact Sheet for Healthcare Providers: https://pope.com/  This test is not yet approved or  cleared by the [place] FDA and has been authorized for detection and/or diagnosis of SARS-CoV-2 by FDA under an Emergency Use Authorization (EUA).  This EUA will remain in effect (meaning this test can be used) for the duration of the COVID-19 declaration under Section 564(b)(1) of the Act, 21 U.S.C. section 360bbb-3(b)(1), unless the authorization is terminated or revoked sooner.  Performed at [facility], [address] [place], [place] [address]          Radiology Studies: DG Knee Complete 4 Views Right  Result Date: 12/27/2019 CLINICAL DATA:  Larey Seat 3 days ago, RIGHT knee pain and swelling EXAM: RIGHT KNEE - COMPLETE 4+ VIEW COMPARISON:  None FINDINGS: Osseous mineralization normal. Joint spaces preserved. Large knee joint effusion. No  acute fracture, dislocation, or bone destruction. Anterior infrapatellar soft tissue swelling. IMPRESSION: Large joint effusion. No acute osseous abnormalities. Electronically Signed   By: Ulyses Southward M.D.   On: 12/27/2019 10:13        Scheduled Meds: . amLODipine  5 mg Oral Daily  . divalproex  1,000 mg Oral q morning - 10a  . enoxaparin (LOVENOX) injection  40 mg Subcutaneous Q24H  . escitalopram  10 mg Oral Daily  . lidocaine  15 mL Mouth/Throat Once   Continuous Infusions:   LOS: 0 days    Vonzella Nipple, MD [facility] Pager on amion   To contact the attending provider between 7A-7P or the covering provider during after hours 7P-7A, please log into the web site [email or web address] and access using universal [place] password for that web site. If you do not have the password, please call the hospital operator.  12/28/2019, 3:49 PM

## 2019-12-29 DIAGNOSIS — F3112 Bipolar disorder, current episode manic without psychotic features, moderate: Secondary | ICD-10-CM | POA: Diagnosis not present

## 2019-12-29 MED ORDER — AMLODIPINE BESYLATE 5 MG PO TABS
5.0000 mg | ORAL_TABLET | Freq: Once | ORAL | Status: AC
  Administered 2019-12-29: 5 mg via ORAL
  Filled 2019-12-29: qty 1

## 2019-12-29 MED ORDER — AMLODIPINE BESYLATE 10 MG PO TABS
10.0000 mg | ORAL_TABLET | Freq: Every day | ORAL | Status: DC
  Administered 2019-12-30 – 2019-12-31 (×2): 10 mg via ORAL
  Filled 2019-12-29 (×2): qty 1

## 2019-12-29 NOTE — TOC Progression Note (Signed)
Transition of Care [facility]) - Progression Note    Patient Details  Name: Lyric Hoar MRN: [id number] Date of Birth: [birth year]  Transition of Care [facility]) CM/SW [place],  Phone Number: 12/29/2019, 4:49 PM  Clinical Narrative:     CSW contacted [facility]; informed they do not have beds.  [facility]; informed no beds available.  Contacted [facility]; they said they are reviewing.   CSW met with pt who explains he feels much better and is not as manic. Explains he is attempting to get an appointment with Dr. Mickie Bail for psychiatry. Explains that even is mostly related to stopping his medications for between 1-2 months. Pt states he is willing to follow psych recommendations though frequently insists that he is doing much better as if he does not want to go to inpatient psych.   CSW also faxed referral to [facility].         Expected Discharge Plan and Services                                                 Social Determinants of Health (SDOH) Interventions    Readmission Risk Interventions No flowsheet data found.

## 2019-12-29 NOTE — Progress Notes (Signed)
PROGRESS NOTE    Jon Ramos  PYP:[id number] DOB: 1965-08-29 DOA: 12/25/2019 PCP: Patient, No Pcp Per    Chief Complaint  Patient presents with  . Seizures    Brief Narrative:  Jon Ramos is a 54 y.o. male with medical history significant of bipolar disorder and hypertension presented with complaints of syncopal episode that occurred on the day of admission at 11.30 in morning.   Patient apparently was at his behavioral health psychiatrist office the morning of admission when the syncopal/seizure episode happened. Per the note of his psychiatrist it appears that patient was having convulsive-like activity laying on the ground with both arms in the air. He has no documented history of pseudoseizures or seizures except when he had a similar episode and was brought to the ER.  He was evaluated in the ED the day prior and discharged with outpatient follow-up.  Sent to ED again because of the second episode.He denies any prodrome-like symptoms. He denies any bowel or bladder incontinence and states he did not feel confused. Denies any headache, vision changes. Denies any focal weakness or numbness.  On further conversation with the patient he says that when he gets emotionally upset he has these episodes.   Assessment & Plan:   Principal Problem:   Syncope and collapse Active Problems:   Bipolar 1 disorder with moderate mania (HCC)  Syncope: -Exact etiology unclear at this time. 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  Neurology was consulted.  EEG did not show any evidence of seizures. -Orthostatic vitals negative. -2D echo and carotid Doppler to complete the work-up.  Carotid Doppler without any acute findings.  2D echo did not show any acute findings. -EEG did not show seizure activity. -Episodes may be likely related to his underlying bipolar/psych issues  Bipolar disorder: -Patient noted to have this episode at the psychiatrist office -Per neurology  possibility of psychogenic nonepileptic spells. -Consulted psychiatry -Ammonia level normal -Continue home medications.  He previously was off Depakote and now restarted again. On Depakote and Lexapro. - Per Psychiatry needs inpatient psych.  Hypertension: - Increased Norvasc dose to 10 mg.  Continue to monitor blood pressure closely and adjust medications as needed.  Hypokalemia: -Potassium replaced  Right knee pain/swelling: -Patient previously complained of right knee swelling.  He says when he had 1 of these "seizure" episodes he probably fell on his knee and had worsening swelling.  On examination he does not have any erythema, open lesions but does have some effusion.  X-ray of the knee per report large joint effusion.  Today knee pain and swelling significantly improved.  He is able to bear weight and ambulate well.   DVT prophylaxis: Subcutaneous Lovenox Code Status: Full Family Communication: Previously talked to wife over the phone.   Disposition: Awaiting inpatient psychiatric facility.  Dispo: The patient is from: Home              Anticipated d/c is to: Unknown at this time, awaiting inpatient psychiatric facility              Patient currently is not medically stable to d/c.    Consultants:   Psychiatry  Procedures:   None   Antimicrobials:   None    Subjective: Right knee pain and swelling improved per patient.  Denies having any major complaints at this time.  Objective: Vitals:   12/28/19 0743 12/28/19 1538 12/28/19 1539 12/29/19 0606  BP: 139/90 (!) 156/109 (!) 156/92 (!) 157/94  Pulse: 83 94 78  Resp: 15 19  20   Temp: 98.4 F (36.9 C) 97.9 F (36.6 C)  98.1 F (36.7 C)  TempSrc:    Oral  SpO2: 98% 100% 100%   Weight:      Height:       No intake or output data in the 24 hours ending 12/29/19 1355 Filed Weights   12/25/19 2340  Weight: 98.1 kg    Examination: General exam: Appears calm and comfortable  Respiratory system: Clear to  auscultation. Respiratory effort normal. Cardiovascular system: S1 & S2 heard, RRR. No murmur. No pedal edema. Gastrointestinal system: Abdomen is nondistended, soft and nontender. No masses felt. Normal bowel sounds heard. Central nervous system: Alert and oriented. No focal neurological deficits. Extremities: swelling right knee improved, no erythema or tenderness. 5 x 5 power. Skin: No rashes, lesions or ulcers Psychiatry: Bipolar.   Data Reviewed: I have personally reviewed following labs and imaging studies  CBC: Recent Labs  Lab 12/24/19 2340 12/25/19 1349 12/26/19 0456 12/27/19 0243  WBC 7.6 7.9 6.2 4.6  NEUTROABS 3.9 6.2  --   --   HGB 16.1 14.1 13.8 13.7  HCT 50.1 41.9 41.3 41.0  MCV 94.2 89.7 90.0 89.1  PLT 291 211 181 179    Basic Metabolic Panel: Recent Labs  Lab 12/25/19 0428 12/25/19 1349 12/26/19 0456 12/27/19 0243 12/28/19 0409  NA 137 138 139 136 138  K 3.4* 3.3* 3.5 3.6 3.9  CL 103 105 103 102 104  CO2 22 21* 22 22 24   GLUCOSE 112* 110* 85 90 94  BUN 22* 20 14 15 11   CREATININE 1.10 1.12 1.13 1.05 0.98  CALCIUM 9.2 9.4 9.3 9.0 9.2  MG  --  2.1  --   --   --     GFR: Estimated Creatinine Clearance: 103.1 mL/min (by C-G formula based on SCr of 0.98 mg/dL).  Liver Function Tests: Recent Labs  Lab 12/24/19 2340 12/25/19 0428 12/26/19 0456  AST 52* 28 98*  ALT 28 22 33  ALKPHOS 49 40 40  BILITOT 1.4* 0.8 1.5*  PROT 7.1 6.2* 6.4*  ALBUMIN 4.4 3.7 3.8    CBG: Recent Labs  Lab 12/25/19 1422  GLUCAP 104*     Recent Results (from the past 240 hour(s))  SARS Coronavirus 2 by RT PCR (hospital order, performed in [facility] hospital lab) Nasopharyngeal Nasopharyngeal Swab     Status: None   Collection Time: 12/25/19  8:52 PM   Specimen: Nasopharyngeal Swab  Result Value Ref Range Status   SARS Coronavirus 2 NEGATIVE NEGATIVE Final    Comment: (NOTE) SARS-CoV-2 target nucleic acids are NOT DETECTED.  The SARS-CoV-2 RNA is generally  detectable in upper and lower respiratory specimens during the acute phase of infection. The lowest concentration of SARS-CoV-2 viral copies this assay can detect is 250 copies / mL. A negative result does not preclude SARS-CoV-2 infection and should not be used as the sole basis for treatment or other patient management decisions.  A negative result may occur with improper specimen collection / handling, submission of specimen other than nasopharyngeal swab, presence of viral mutation(s) within the areas targeted by this assay, and inadequate number of viral copies (<250 copies / mL). A negative result must be combined with clinical observations, patient history, and epidemiological information.  Fact Sheet for Patients:   12/27/19  Fact Sheet for Healthcare Providers: [facility]  This test is not yet approved or  cleared by the 12/27/19 FDA and has been  authorized for detection and/or diagnosis of SARS-CoV-2 by FDA under an Emergency Use Authorization (EUA).  This EUA will remain in effect (meaning this test can be used) for the duration of the COVID-19 declaration under Section 564(b)(1) of the Act, 21 U.S.C. section 360bbb-3(b)(1), unless the authorization is terminated or revoked sooner.  Performed at [facility], [address], [place], [place] [address]          Radiology Studies: No results found.      Scheduled Meds: . [START ON 12/30/2019] amLODipine  10 mg Oral Daily  . divalproex  1,000 mg Oral q morning - 10a  . enoxaparin (LOVENOX) injection  40 mg Subcutaneous Q24H  . escitalopram  10 mg Oral Daily  . lidocaine  15 mL Mouth/Throat Once   Continuous Infusions:   LOS: 0 days    Vonzella Nipple, MD [facility] Pager on amion   To contact the attending provider between 7A-7P or the covering provider during after hours 7P-7A, please log into the web site [email or web address]  and access using universal [place] password for that web site. If you do not have the password, please call the hospital operator.  12/29/2019, 1:55 PM

## 2019-12-30 DIAGNOSIS — F445 Conversion disorder with seizures or convulsions: Secondary | ICD-10-CM | POA: Diagnosis not present

## 2019-12-30 NOTE — Progress Notes (Signed)
[facility] PROGRESS NOTE    Progress Note  Jon Ramos  YKD:[id number] DOB: March 04, 1966 DOA: 12/25/2019 PCP: Patient, No Pcp Per     Brief Narrative:   Jon Ramos is an 54 y.o. male past medical history significant for bipolar disorder, essential hypertension seen in the hospital the day prior to admission for syncopal episode presents with complaints of syncopal episode that occurred on the day of admission.  Patient was apparently at her psychiatrist's office Per psychiatry notes it appears the patient has not chronic clonic like movement laying in the ground he has no documented history of seizures or pseudoseizures.  He was evaluated in the ED prior to the day of admission and told to follow-up as an outpatient.  He denies any prodromal symptoms bladder or bowel incontinence headache or vision changes.  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 Neurology was consulted EEG did not show evidence of seizures.  Orthostatics vitals are negative. 2D echo and carotid Doppler were unremarkable. Episodes might be related likely due to bipolar and psychiatric issues. 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 We will reconsult psychiatry for further evaluation is been 4 days patient is able to have a reasonable conversation has had no episodes of pseudoseizures.  Bipolar disorder: Continue Depakote and Lexapro, per psychiatry needs inpatient psychiatric admission. Patient is medically stable to transfer to inpatient psych.  Essential hypertension: Increase Norvasc to 10 well-controlled continue current regimen.  Hypokalemia: Repleted orally, now resolved.  Knee pain/swelling: Physical exam is unremarkable, x-ray of the knee shows a large effusion.  Able to bear weight.      DVT prophylaxis: lovenox Family Communication:none Status is: Inpatient  Remains inpatient appropriate  because:Unsafe d/c plan   Dispo: The patient is from: Home              Anticipated d/c is to: Inpatient psychiatric facility              Anticipated d/c date is: 3 days              Patient currently is medically stable to d/c.  Code Status:     Code Status Orders  (From admission, onward)         Start     Ordered   12/25/19 1828  Full code  Continuous        12/25/19 1829        Code Status History    This patient has a current code status but no historical code status.   Advance Care Planning Activity        IV Access:    Peripheral IV   Procedures and diagnostic studies:   No results found.   Medical Consultants:    None.  Anti-Infectives:   none  Subjective:    Jon Ramos feels great no complaints.  Objective:    Vitals:   12/29/19 1733 12/29/19 1822 12/29/19 2257 12/30/19 0814  BP: (!) 176/109 (!) 153/107 133/77 (!) 158/103  Pulse: (!) 104 (!) 115 100 91  Resp: 14  20 16   Temp: 98.1 F (36.7 C)  98.5 F (36.9 C) 98.1 F (36.7 C)  TempSrc:    Oral  SpO2: 100%  98% 100%  Weight:      Height:       SpO2: 100 %  No intake or output data in the 24 hours ending 12/30/19 1001 Filed Weights   12/25/19 2340  Weight: 98.1 kg    Exam: General exam: In no acute distress. Respiratory system: Good air movement and clear to auscultation. Cardiovascular system: S1 & S2 heard, RRR. No JVD Gastrointestinal system: Abdomen is nondistended, soft and nontender.  Extremities: No pedal edema. Skin: No rashes, lesions or ulcers Psychiatry: Judgement and insight appear normal.    Data Reviewed:    Labs: Basic Metabolic Panel: Recent Labs  Lab 12/25/19 0428 12/25/19 0428 12/25/19 1349 12/25/19 1349 12/26/19 0456 12/26/19 0456 12/27/19 0243 12/28/19 0409  NA 137  --  138  --  139  --  136 138  K 3.4*   < > 3.3*   < > 3.5   < > 3.6 3.9  CL 103  --  105  --  103  --  102 104  CO2 22  --  21*  --  22  --  22 24  GLUCOSE 112*   --  110*  --  85  --  90 94  BUN 22*  --  20  --  14  --  15 11  CREATININE 1.10  --  1.12  --  1.13  --  1.05 0.98  CALCIUM 9.2  --  9.4  --  9.3  --  9.0 9.2  MG  --   --  2.1  --   --   --   --   --    < > = values in this interval not displayed.   GFR Estimated Creatinine Clearance: 103.1 mL/min (by C-G formula based on SCr of 0.98 mg/dL). Liver Function Tests: Recent Labs  Lab 12/24/19 2340 12/25/19 0428 12/26/19 0456  AST 52* 28 98*  ALT 28 22 33  ALKPHOS 49 40 40  BILITOT 1.4* 0.8 1.5*  PROT 7.1 6.2* 6.4*  ALBUMIN 4.4 3.7 3.8   No results for input(s): LIPASE, AMYLASE in the last 168 hours. Recent Labs  Lab 12/25/19 1817  AMMONIA 15   Coagulation profile No results for input(s): INR, PROTIME in the last 168 hours. COVID-19 Labs  No results for input(s): DDIMER, FERRITIN, LDH, CRP in the last 72 hours.  Lab Results  Component Value Date   SARSCOV2NAA NEGATIVE 12/25/2019    CBC: Recent Labs  Lab 12/24/19 2340 12/25/19 1349 12/26/19 0456 12/27/19 0243  WBC 7.6 7.9 6.2 4.6  NEUTROABS 3.9 6.2  --   --   HGB 16.1 14.1 13.8 13.7  HCT 50.1 41.9 41.3 41.0  MCV 94.2 89.7 90.0 89.1  PLT 291 211 181 179   Cardiac Enzymes: No results for input(s): CKTOTAL, CKMB, CKMBINDEX, TROPONINI in the last 168 hours. BNP (last 3 results) No results for input(s): PROBNP in the last 8760 hours. CBG: Recent Labs  Lab 12/25/19 1422  GLUCAP 104*   D-Dimer: No results for input(s): DDIMER in the last 72 hours. Hgb A1c: No results for input(s): HGBA1C in the last 72 hours. Lipid Profile: No results for input(s): CHOL, HDL, LDLCALC, TRIG, CHOLHDL, LDLDIRECT in the last 72 hours. Thyroid function studies: No results for input(s): TSH, T4TOTAL, T3FREE, THYROIDAB in the last 72 hours.  Invalid input(s): FREET3 Anemia work up: No results for input(s): VITAMINB12, FOLATE, FERRITIN, TIBC, IRON, RETICCTPCT in the last 72 hours. Sepsis Labs: Recent Labs  Lab 12/24/19 2340  12/25/19 1349 12/26/19 0456 12/27/19 0243  WBC 7.6 7.9 6.2 4.6   Microbiology Recent Results (from the past 240 hour(s))  SARS Coronavirus 2 by RT PCR (hospital order, performed in [facility] hospital lab) Nasopharyngeal Nasopharyngeal Swab     Status: None   Collection Time: 12/25/19  8:52 PM   Specimen: Nasopharyngeal Swab  Result Value Ref Range Status   SARS Coronavirus 2 NEGATIVE NEGATIVE Final    Comment: (NOTE) SARS-CoV-2 target nucleic acids are NOT DETECTED.  The SARS-CoV-2 RNA is generally detectable in upper and lower respiratory specimens during the acute phase of infection. The lowest concentration of SARS-CoV-2 viral copies this assay can detect is 250 copies / mL. A negative result does not preclude SARS-CoV-2 infection and should not be used as the sole basis for treatment or other patient management decisions.  A negative result may occur with improper specimen collection / handling, submission of specimen other than nasopharyngeal swab, presence of viral mutation(s) within the areas targeted by this assay, and inadequate number of viral copies (<250 copies / mL). A negative result must be combined with clinical observations, patient history, and epidemiological information.  Fact Sheet for Patients:   BoilerBrush.com.cy  Fact Sheet for Healthcare Providers: https://pope.com/  This test is not yet approved or  cleared by the [place] FDA and has been authorized for detection and/or diagnosis of SARS-CoV-2 by FDA under an Emergency Use Authorization (EUA).  This EUA will remain in effect (meaning this test can be used) for the duration of the COVID-19 declaration under Section 564(b)(1) of the Act, 21 U.S.C. section 360bbb-3(b)(1), unless the authorization is terminated or revoked sooner.  Performed at [facility], [address], [place], [place] [address]      Medications:    amLODipine   10 mg Oral Daily   divalproex  1,000 mg Oral q morning - 10a   enoxaparin (LOVENOX) injection  40 mg Subcutaneous Q24H   escitalopram  10 mg Oral Daily   lidocaine  15 mL Mouth/Throat Once   Continuous Infusions:    LOS: 1 day   Marinda Elk  [facility]  12/30/2019, 10:01 AM

## 2019-12-30 NOTE — Consult Note (Signed)
°  Attending provider requests psychiatric re-evaluation. Patient assessed by nurse practitioner. Patient alert and oriented, answers appropriately.   Patient states "both my wife and I have recently have gone to [place] to rescue both mother-in-law and father-in-law from an apartment."  COVID has been particularly hard because "my father-in-law has come to live with [place] but he is not happy about where he ended up." Patient reports "this was a smack in the face."  "I'm not proud of the way I handled the stressors along the way, I did some drinking." Patient states" I have had to come to terms with talking clearly and quickly, I have a high functioning brain and a lot of people can't handle it." Patient verbalizes "I know he is just a medical doctor, he looked at me straight in the eyes and told me, I wouldn't disagree that you're ready to take that next step." Patient continue to have disorganized conversation.  Patient denies suicidal ideations, patient denies history of suicide attempts, denies self-harm behaviors. Patient denies homicidal ideations. Patient denies auditory and visual hallucinations. Patient denies symptoms of paranoia.  Patient continues to exhibit tangential conversation with apparent grandiose delusions. States "I have a high functioning brain.."  " Fredia Beets a history in radio.."  Patient agrees with plan for inpatient psychiatric hospitalization. Patient states "I would like for someone, with letters behind their name, to tell my wife that I am ready to head home."  Patient reports"new symptoms with this break including seizures, I knew my Depakote level was off, I was extremely unhappy with how I was being treated by my outpatient nurse practitioner as a practice."  Patient offered support and encouragement. Patient diagnosed with bipolar 1 disorder, currently appears hypomanic. Discussed current medications and recommended changes including discontinue Lexapro 10 mg daily, recommend  consider initiate Vistaril 25 mg by mouth 3 times daily as needed/anxiety. Continue Depakote ER 1000mg  daily.  Patient discussed with Dr. . Continue to recommend inpatient treatment.

## 2019-12-31 ENCOUNTER — Inpatient Hospital Stay
Admit: 2019-12-31 | Discharge: 2020-01-08 | DRG: 885 | Disposition: A | Discharge status: Home / Self Care | Payer: BC Managed Care – PPO | Source: Intra-hospital | Attending: Psychiatry | Admitting: Psychiatry

## 2019-12-31 DIAGNOSIS — F1010 Alcohol abuse, uncomplicated: Secondary | ICD-10-CM

## 2019-12-31 DIAGNOSIS — R55 Syncope and collapse: Secondary | ICD-10-CM | POA: Diagnosis present

## 2019-12-31 DIAGNOSIS — R569 Unspecified convulsions: Secondary | ICD-10-CM | POA: Diagnosis present

## 2019-12-31 DIAGNOSIS — F3112 Bipolar disorder, current episode manic without psychotic features, moderate: Secondary | ICD-10-CM | POA: Diagnosis not present

## 2019-12-31 DIAGNOSIS — F319 Bipolar disorder, unspecified: Secondary | ICD-10-CM | POA: Diagnosis present

## 2019-12-31 MED ORDER — ALUM & MAG HYDROXIDE-SIMETH 200-200-20 MG/5ML PO SUSP: 30.0000 mL | ORAL | Status: DC | PRN

## 2019-12-31 MED ORDER — ACETAMINOPHEN 325 MG PO TABS
650.0000 mg | ORAL_TABLET | Freq: Four times a day (QID) | ORAL | Status: DC | PRN
  Administered 2020-01-01 – 2020-01-07 (×5): 650 mg via ORAL
  Filled 2019-12-31 (×5): qty 2

## 2019-12-31 MED ORDER — MAGNESIUM HYDROXIDE 400 MG/5ML PO SUSP: 30.0000 mL | Freq: Every day | ORAL | Status: DC | PRN

## 2019-12-31 MED ORDER — AMLODIPINE BESYLATE 10 MG PO TABS: 10.0000 mg | ORAL_TABLET | Freq: Every day | ORAL | 0 refills | Status: DC

## 2019-12-31 MED ORDER — TRAZODONE HCL 50 MG PO TABS
50.0000 mg | ORAL_TABLET | Freq: Every evening | ORAL | Status: DC | PRN
  Administered 2020-01-01 – 2020-01-02 (×2): 50 mg via ORAL
  Filled 2019-12-31 (×3): qty 1

## 2019-12-31 NOTE — BH Assessment (Signed)
Patient has been accepted to [facility].  Accepting physician is Dr. Toni Amend.  Attending  Physician will be Dr. Toni Amend.  Patient has been assigned to room 316, by [facility] Charge Nurse [place].   Call report to [phone].  Representative/Transfer Coordinator is [employer], TTS. Patient pre-admitted by [facility] Patient Access Ethelene Browns)  [facility] ER Staff Joycie Peek, Transition of Care)  made aware of acceptance. Report can be called to [phone] after 8: 30 PM.
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
## 2019-12-31 NOTE — TOC Progression Note (Addendum)
Transition of Care [facility]) - Progression Note    Patient Details  Name: Jon Ramos MRN: [id number] Date of Birth: Feb 12, 1966  Transition of Care [facility]) CM/SW Contact  Erin Sons, [place] Phone Number: 12/31/2019, 4:20 PM  Clinical Narrative:    CSW informed that pt can be accepted tonight at [facility]. Call report listed in BMU disposition note after 830 pm. Once report is called RN will schedule transport by calling [phone] and ask for safe transport to be arranged.   CSW informed pt wife.   CSW faxed voluntary admission and consent for tx form to [facility] BMU [phone]   Expected Discharge Plan: Psychiatric Hospital Barriers to Discharge: Psych Bed not available  Expected Discharge Plan and Services Expected Discharge Plan: Psychiatric Hospital         Expected Discharge Date: 12/31/19                                     Social Determinants of Health (SDOH) Interventions    Readmission Risk Interventions No flowsheet data found.

## 2019-12-31 NOTE — TOC Progression Note (Signed)
Transition of Care [facility]) - Progression Note    Patient Details  Name: Jon Ramos MRN: [id number] Date of Birth: 05/21/1966  Transition of Care [facility]) CM/SW Contact  Erin Sons, [place] Phone Number: 12/31/2019, 2:33 PM  Clinical Narrative:     CSW faxed [facility] referral. Called to follow up and informed pt denied due to medical reasons. CSW inquired to what medical reasons. Staff person stated "seizures" but was unable to expand. Transferred CSW to other staff who informed CSW repeatedly that pt was denied due to medical reasons.   CSW refaxed referral to [facility], [employer] and also faxed to [facility].   Pt found CSW on unit as he was walking around. Pt still appears to be hypomanic.Was requesting to speak with CSW.   1436; CSW received call from [facility] and was informed that they could accept pt in AM. Informed that pt would be with Dr. Loyola Mast and to call report in AM to [phone].   Expected Discharge Plan: Psychiatric Hospital Barriers to Discharge: Psych Bed not available  Expected Discharge Plan and Services Expected Discharge Plan: Psychiatric Hospital         Expected Discharge Date: 12/31/19                                     Social Determinants of Health (SDOH) Interventions    Readmission Risk Interventions No flowsheet data found.

## 2020-01-01 ENCOUNTER — Other Ambulatory Visit: Payer: Self-pay

## 2020-01-01 DIAGNOSIS — F1010 Alcohol abuse, uncomplicated: Secondary | ICD-10-CM

## 2020-01-01 DIAGNOSIS — F3112 Bipolar disorder, current episode manic without psychotic features, moderate: Secondary | ICD-10-CM

## 2020-01-01 DIAGNOSIS — R569 Unspecified convulsions: Secondary | ICD-10-CM

## 2020-01-01 MED ORDER — QUETIAPINE FUMARATE 100 MG PO TABS
100.0000 mg | ORAL_TABLET | Freq: Every day | ORAL | Status: DC
  Administered 2020-01-01 – 2020-01-03 (×3): 100 mg via ORAL
  Filled 2020-01-01 (×3): qty 1

## 2020-01-01 MED ORDER — DIVALPROEX SODIUM ER 500 MG PO TB24
1500.0000 mg | ORAL_TABLET | Freq: Every day | ORAL | Status: DC
  Administered 2020-01-01 – 2020-01-04 (×4): 1500 mg via ORAL
  Filled 2020-01-01 (×4): qty 3

## 2020-01-01 NOTE — Plan of Care (Signed)
Patient new to the unit tonight  Problem: Education: Goal: Knowledge of [place] [facility] Education information/materials will improve Outcome: Not Progressing Goal: Emotional status will improve Outcome: Not Progressing Goal: Mental status will improve Outcome: Not Progressing Goal: Verbalization of understanding the information provided will improve Outcome: Not Progressing   Problem: Safety: Goal: Periods of time without injury will increase Outcome: Not Progressing   Problem: Activity: Goal: Will verbalize the importance of balancing activity with adequate rest periods Outcome: Not Progressing

## 2020-01-01 NOTE — Progress Notes (Signed)
Recreation Therapy Notes  Date: 01/01/2020  Time: 9:30 am  Location: Craft room   Behavioral response: Appropriate, Hyperverbal, Redirection needed  Intervention Topic: Happiness   Discussion/Intervention:  Group content today was focused on Happiness. The group defined happiness and described where happiness comes from. Individuals identified what makes them happy and how they go about making others happy. Patients expressed things that stop them from being happy and ways they can improve their happiness. The group stated reasons why it is important to be happy. The group participated in the intervention "My Happiness", where they had a chance to identify and express things that make them happy. Clinical Observations/Feedback:  Patient came to group and introduced himself to his peers and group facilitator. He went on a tangent expressed how he got in the hospital and was fixated on meeting everyone in group and why they were admitted. Participant stated that this situation is his new job and he is going to work on getting out of here. Patient needed much redirection to stay on topic and focus on himself. Individual was actively social with peers and staff while participating in the intervention.  [facility]         Loran Auguste 01/01/2020 1:03 PM

## 2020-01-01 NOTE — Plan of Care (Signed)
D: Pt alert and oriented x 4. Pt rates  anxiety 8/10. Pt reports experiencing discomfort in right knee and his tongue. Pt denies experiencing any SI/HI, or AVH at this time.   Pt has corner every staff member and pt speaking to each for an extremely period of time.   A: Support and encouragement provided. Frequent verbal contact made. Routine safety checks conducted q15 minutes.   R: No adverse drug reactions noted. Pt verbally contracts for safety at this time. Pt remains safe at this time. Will continue to monitor.   Problem: Education: Goal: Knowledge of [facility] Ramos Education information/materials will improve 01/01/2020 0949 by Sharin Mons, RN Outcome: Not Progressing 01/01/2020 0949 by Sharin Mons, RN Outcome: Not Progressing Goal: Emotional status will improve 01/01/2020 0949 by Sharin Mons, RN Outcome: Not Progressing 01/01/2020 0949 by Sharin Mons, RN Outcome: Not Progressing Goal: Mental status will improve 01/01/2020 0949 by Sharin Mons, RN Outcome: Not Progressing 01/01/2020 0949 by Sharin Mons, RN Outcome: Not Progressing Goal: Verbalization of understanding the information provided will improve 01/01/2020 0949 by Sharin Mons, RN Outcome: Not Progressing 01/01/2020 0949 by Sharin Mons, RN Outcome: Not Progressing   Problem: Safety: Goal: Periods of time without injury will increase 01/01/2020 0949 by Sharin Mons, RN Outcome: Not Progressing 01/01/2020 0949 by Sharin Mons, RN Outcome: Not Progressing   Problem: Activity: Goal: Will verbalize the importance of balancing activity with adequate rest periods 01/01/2020 0949 by Sharin Mons, RN Outcome: Not Progressing 01/01/2020 0949 by Sharin Mons, RN Outcome: Not Progressing   Problem: Safety: Goal: Ability to redirect hostility and anger into socially appropriate behaviors will improve 01/01/2020 0949 by Sharin Mons, RN Outcome: Not Progressing 01/01/2020 0949 by  Sharin Mons, RN Outcome: Not Progressing Goal: Ability to remain free from injury will improve 01/01/2020 0949 by Sharin Mons, RN Outcome: Not Progressing 01/01/2020 0949 by Sharin Mons, RN Outcome: Not Progressing

## 2020-01-01 NOTE — [facility] Group Notes (Signed)
LCSW Group Therapy Note  01/01/2020 2:38 PM  Type of Therapy and Topic:  Group Therapy:  Feelings around Relapse and Recovery  Participation Level:  Did Not Attend   Description of Group:    Patients in this group will discuss emotions they experience before and after a relapse. They will process how experiencing these feelings, or avoidance of experiencing them, relates to having a relapse. Facilitator will guide patients to explore emotions they have related to recovery. Patients will be encouraged to process which emotions are more powerful. They will be guided to discuss the emotional reaction significant others in their lives may have to their relapse or recovery. Patients will be assisted in exploring ways to respond to the emotions of others without this contributing to a relapse.  Therapeutic Goals: 1. Patient will identify two or more emotions that lead to a relapse for them 2. Patient will identify two emotions that result when they relapse 3. Patient will identify two emotions related to recovery 4. Patient will demonstrate ability to communicate their needs through discussion and/or role plays   Summary of Patient Progress: X  Therapeutic Modalities:   Cognitive Behavioral Therapy Solution-Focused Therapy Assertiveness Training Relapse Prevention Therapy   Lavetta Geier, MSW, LCSW 01/01/2020 2:38 PM  

## 2020-01-01 NOTE — Progress Notes (Signed)
Patient is 54yr old male presents under no acute distress. He is hyper-verbal but pleasant and easy to engage. He was cooperative with the admission process. He signed all admission documents. His skin assessment conducted with RN Susy Frizzle was unremarkable with no abnormal marks or bruises. No contraband found on person or in property.  He currently denies SI/HI/AVH depression and anxiety at this encounter. He was acclimated to the unit and remains safe at this time with 15 minute safety checks. He was informed to contact staff with any concerns.

## 2020-01-01 NOTE — Tx Team (Signed)
Initial Treatment Plan 01/01/2020 4:43 AM Kristine Linea ELF:[id number]    PATIENT STRESSORS: Loss of mother inlaw Marital or family conflict Medication change or noncompliance   PATIENT STRENGTHS: Average or above average intelligence Capable of independent living Licensed conveyancer Motivation for treatment/growth Supportive family/friends   PATIENT IDENTIFIED PROBLEMS: Family dynamics  Medication adjustment needed                   DISCHARGE CRITERIA:  Ability to meet basic life and health needs Improved stabilization in mood, thinking, and/or behavior Motivation to continue treatment in a less acute level of care  PRELIMINARY DISCHARGE PLAN: Outpatient therapy Return to previous living arrangement Return to previous work or school arrangements  PATIENT/FAMILY INVOLVEMENT: This treatment plan has been presented to and reviewed with the patient, Jon Ramos.   The patient has been given the opportunity to ask questions and make suggestions.  Sundus Pete L Butler-Nicholson, LPN 11/07/5275, 8:24 AM

## 2020-01-01 NOTE — [facility] Suicide Risk Assessment (Signed)
[facility] Admission Suicide Risk Assessment   Nursing information obtained from:  Patient Demographic factors:  NA Current Mental Status:  NA Loss Factors:  NA Historical Factors:  NA Risk Reduction Factors:  NA  Total Time spent with patient: 1 hour Principal Problem: Bipolar 1 disorder with moderate mania (HCC) Diagnosis:  Principal Problem:   Bipolar 1 disorder with moderate mania (HCC) Active Problems:   Syncope and collapse   Bipolar 1 disorder (HCC)   Seizures (HCC)   Alcohol abuse  Subjective Data: Patient seen.  Chart reviewed.  Patient presented with seizure-like episode followed by confusion and disorganized thinking.  No report of any suicidal ideation or self injury.  He denies any suicidal thoughts denies feeling depressed.  Patient is confused hyper religious hyperverbal and appears to be manic.  He is cooperative however with treatment planning  Continued Clinical Symptoms:  Alcohol Use Disorder Identification Test Final Score (AUDIT): 0 The "Alcohol Use Disorders Identification Test", Guidelines for Use in Primary Care, Second Edition.  World Science writer [facility]). Score between 0-7:  no or low risk or alcohol related problems. Score between 8-15:  moderate risk of alcohol related problems. Score between 16-19:  high risk of alcohol related problems. Score 20 or above:  warrants further diagnostic evaluation for alcohol dependence and treatment.   CLINICAL FACTORS:   Bipolar Disorder:   Mixed State   Musculoskeletal: Strength & Muscle Tone: within normal limits Gait & Station: normal Patient leans: N/A  Psychiatric Specialty Exam: Physical Exam Vitals and nursing note reviewed.  Constitutional:      Appearance: He is well-developed.  HENT:     Head: Normocephalic and atraumatic.  Eyes:     Conjunctiva/sclera: Conjunctivae normal.     Pupils: Pupils are equal, round, and reactive to light.  Cardiovascular:     Heart sounds: Normal heart sounds.   Pulmonary:     Effort: Pulmonary effort is normal.  Abdominal:     Palpations: Abdomen is soft.  Musculoskeletal:        General: Normal range of motion.     Cervical back: Normal range of motion.  Skin:    General: Skin is warm and dry.  Neurological:     General: No focal deficit present.     Mental Status: He is alert.  Psychiatric:        Attention and Perception: Attention normal.        Mood and Affect: Mood is anxious. Affect is labile and inappropriate.        Speech: Speech is rapid and pressured.        Behavior: Behavior is agitated and hyperactive. Behavior is not aggressive.        Thought Content: Thought content is not paranoid. Thought content does not include homicidal or suicidal ideation.        Cognition and Memory: Cognition is impaired.        Judgment: Judgment is inappropriate.     Review of Systems  Constitutional: Negative.   HENT: Negative.   Eyes: Negative.   Respiratory: Negative.   Cardiovascular: Negative.   Gastrointestinal: Negative.   Musculoskeletal: Negative.   Skin: Negative.   Neurological: Negative.   Psychiatric/Behavioral: Positive for agitation and sleep disturbance. The patient is nervous/anxious.     Blood pressure (!) 158/115, pulse (!) 112, temperature 98.5 F (36.9 C), temperature source Oral, resp. rate 18, height 5\' 9"  (1.753 m), weight 98.9 kg, SpO2 100 %.Body mass index is 32.19 kg/m.  General Appearance:  Fairly Groomed  Eye Contact:  Good  Speech:  Pressured  Volume:  Increased  Mood:  Anxious, Euphoric and Irritable  Affect:  Congruent and Labile  Thought Process:  Disorganized  Orientation:  Full (Time, Place, and Person)  Thought Content:  Rumination and Tangential  Suicidal Thoughts:  No  Homicidal Thoughts:  No  Memory:  Immediate;   Fair Recent;   Poor Remote;   Fair  Judgement:  Fair  Insight:  Fair  Psychomotor Activity:  Increased  Concentration:  Concentration: Poor  Recall:  [employer] of  Knowledge:  Fair  Language:  Fair  Akathisia:  No  Handed:  Right  AIMS (if indicated):     Assets:  Desire for Improvement Housing Physical Health Resilience Social Support  ADL's:  Intact  Cognition:  WNL  Sleep:  Number of Hours: 1      COGNITIVE FEATURES THAT CONTRIBUTE TO RISK:  Polarized thinking    SUICIDE RISK:   Minimal: No identifiable suicidal ideation.  Patients presenting with no risk factors but with morbid ruminations; may be classified as minimal risk based on the severity of the depressive symptoms  PLAN OF CARE: Restarting medicine for bipolar disorder.  Engage in individual and group therapy.  Full lab assessment.  Reassess dangerousness prior to discharge planning  I certify that inpatient services furnished can reasonably be expected to improve the patient's condition.   Mordecai Rasmussen, MD 01/01/2020, 6:28 PM

## 2020-01-01 NOTE — H&P (Signed)
Psychiatric Admission Assessment Adult  Patient Identification: Jon Ramos MRN:  [id number] Date of Evaluation:  01/01/2020 Chief Complaint:  Bipolar 1 disorder (HCC) [F31.9] Principal Diagnosis: Bipolar 1 disorder with moderate mania (HCC) Diagnosis:  Principal Problem:   Bipolar 1 disorder with moderate mania (HCC) Active Problems:   Syncope and collapse   Bipolar 1 disorder (HCC)   Seizures (HCC)   Alcohol abuse  History of Present Illness: Patient seen and chart reviewed.  Also, with the patient's consent, spoke with his wife.  54 year old man who presented to the hospital several days ago with report of a seizure.  Initially treated as just the first episode seizure and was to be discharged from the hospital he quickly decompensated and returned only hours after his last discharge with confusion and more seizure-like activity.  Was admitted to the medical service.  It seems the conclusion was reached that these were psychogenic seizures although I am not convinced that is necessarily true.  More generally the patient has been having manic symptoms that have been coming on probably for a few weeks.  His mood has been getting more labile and more agitated.  Sleep has been very poor at home.  He had been previously treated with Depakote chronically for bipolar disorder but had been off it recently for at least several weeks for reasons that remain a little unclear to me.  Additionally the patient claims that he had been drinking heavily until just a week or 2 ago.  His history around this is confusing and seems to change from moment to moment.  His wife says that he has a history of alcohol abuse and tends to drink secretly and she does not know for sure how much he has been drinking.  Patient had been getting grandiose hyperactive but there was no report of any suicidal violent or threatening behavior.  Patient is now showing good insight and is requesting to be restarted on medicine for  treatment.  He is still showing hyper religiosity.  Gets tangential talking about the Bible very easily which seems a little odd for the rest of his character.  Patient is showing signs of paranoia as well.  He became fixated on 2 standard pieces of paperwork that patients are asked to sign and has scribbled notes all over them and is willing to talk at great length about whether or not he should sign them. Associated Signs/Symptoms: Depression Symptoms:  psychomotor agitation, difficulty concentrating, impaired memory, anxiety, disturbed sleep, (Hypo) Manic Symptoms:  Flight of Ideas, Grandiosity, Hallucinations, Impulsivity, Labiality of Mood, Anxiety Symptoms:  Excessive Worry, Psychotic Symptoms:  Paranoia, PTSD Symptoms: Negative Total Time spent with patient: 1 hour  Past Psychiatric History: Patient reports a history of bipolar disorder going back to age 54 when he had his first manic episode resulting in a hospitalization of about 6 weeks.  Was treated with lithium in the past.  At some point in his 5520s had 2 more episodes of mania.  At some time in mid life was switched to Depakote and evidently had been stable for years.  Longstanding history of intermittent alcohol problems especially when mood symptoms are present.  Denies any history of suicide attempts or violence.  Is the patient at risk to self? No.  Has the patient been a risk to self in the past 6 months? No.  Has the patient been a risk to self within the distant past? No.  Is the patient a risk to others? No.  Has the  patient been a risk to others in the past 6 months? No.  Has the patient been a risk to others within the distant past? No.   Prior Inpatient Therapy:   Prior Outpatient Therapy:    Alcohol Screening: 1. How often do you have a drink containing alcohol?: Never 2. How many drinks containing alcohol do you have on a typical day when you are drinking?: 1 or 2 3. How often do you have six or more drinks  on one occasion?: Never AUDIT-C Score: 0 4. How often during the last year have you found that you were not able to stop drinking once you had started?: Never 5. How often during the last year have you failed to do what was normally expected from you because of drinking?: Never 6. How often during the last year have you needed a first drink in the morning to get yourself going after a heavy drinking session?: Never 7. How often during the last year have you had a feeling of guilt of remorse after drinking?: Never 8. How often during the last year have you been unable to remember what happened the night before because you had been drinking?: Never 9. Have you or someone else been injured as a result of your drinking?: No 10. Has a relative or friend or a doctor or another health worker been concerned about your drinking or suggested you cut down?: No Alcohol Use Disorder Identification Test Final Score (AUDIT): 0 Alcohol Brief Interventions/Follow-up: AUDIT Score <7 follow-up not indicated Substance Abuse History in the last 12 months:  Yes.   Consequences of Substance Abuse: I think it remains an open question whether the alcohol or alcohol withdrawal could have played any part in these seizures.  Also he has concerns about liver function although that does not seem to have been a clinical condition so far Previous Psychotropic Medications: Yes  Psychological Evaluations: Yes  Past Medical History:  Past Medical History:  Diagnosis Date  . Bipolar 1 disorder (HCC)   . Hypertension    History reviewed. No pertinent surgical history. Family History: History reviewed. No pertinent family history. Family Psychiatric  History: Patient knows that his maternal grandfather killed himself.  Not sure what the diagnosis was. Tobacco Screening:   Social History:  Social History   Substance and Sexual Activity  Alcohol Use No     Social History   Substance and Sexual Activity  Drug Use No     Additional Social History:                           Allergies:   Allergies  Allergen Reactions  . Penicillins Hives  . Sulfa Antibiotics Hives   Lab Results: No results found for this or any previous visit (from the past 48 hour(s)).  Blood Alcohol level:  Lab Results  Component Value Date   ETH <10 12/24/2019    Metabolic Disorder Labs:  No results found for: HGBA1C, MPG No results found for: PROLACTIN No results found for: CHOL, TRIG, HDL, CHOLHDL, VLDL, LDLCALC  Current Medications: Current Facility-Administered Medications  Medication Dose Route Frequency Provider Last Rate Last Admin  . acetaminophen (TYLENOL) tablet 650 mg  650 mg Oral Q6H PRN Gillermo Murdoch, NP      . alum & mag hydroxide-simeth (MAALOX/MYLANTA) 200-200-20 MG/5ML suspension 30 mL  30 mL Oral Q4H PRN Gillermo Murdoch, NP      . divalproex (DEPAKOTE ER) 24 hr tablet 1,500  mg  1,500 mg Oral QHS Dewan Emond T, MD      . magnesium hydroxide (MILK OF MAGNESIA) suspension 30 mL  30 mL Oral Daily PRN Gillermo Murdoch, NP      . QUEtiapine (SEROQUEL) tablet 100 mg  100 mg Oral QHS Dvonte Gatliff T, MD      . traZODone (DESYREL) tablet 50 mg  50 mg Oral QHS PRN Gillermo Murdoch, NP       PTA Medications: Medications Prior to Admission  Medication Sig Dispense Refill Last Dose  . amLODipine (NORVASC) 10 MG tablet Take 1 tablet (10 mg total) by mouth daily. 30 tablet 0   . divalproex (DEPAKOTE ER) 500 MG 24 hr tablet Take 1,000 mg by mouth every morning.     . escitalopram (LEXAPRO) 10 MG tablet Take 10 mg by mouth daily.       Musculoskeletal: Strength & Muscle Tone: within normal limits Gait & Station: normal Patient leans: N/A  Psychiatric Specialty Exam: Physical Exam Vitals and nursing note reviewed.  Constitutional:      Appearance: He is well-developed.  HENT:     Head: Normocephalic and atraumatic.  Eyes:     Conjunctiva/sclera: Conjunctivae normal.     Pupils:  Pupils are equal, round, and reactive to light.  Cardiovascular:     Heart sounds: Normal heart sounds.  Pulmonary:     Effort: Pulmonary effort is normal.  Abdominal:     Palpations: Abdomen is soft.  Musculoskeletal:        General: Normal range of motion.     Cervical back: Normal range of motion.  Skin:    General: Skin is warm and dry.  Neurological:     General: No focal deficit present.     Mental Status: He is alert.  Psychiatric:        Attention and Perception: Attention normal.        Mood and Affect: Affect is labile.        Speech: Speech is rapid and pressured.        Behavior: Behavior is agitated and hyperactive. Behavior is not aggressive.        Thought Content: Thought content is paranoid. Thought content does not include homicidal or suicidal ideation.        Cognition and Memory: Cognition is impaired.        Judgment: Judgment is inappropriate.     Review of Systems  Constitutional: Negative.   HENT: Negative.   Eyes: Negative.   Respiratory: Negative.   Cardiovascular: Negative.   Gastrointestinal: Negative.   Musculoskeletal: Negative.   Skin: Negative.   Neurological: Negative.   Psychiatric/Behavioral: Positive for confusion and sleep disturbance. The patient is nervous/anxious.     Blood pressure (!) 158/115, pulse (!) 112, temperature 98.5 F (36.9 C), temperature source Oral, resp. rate 18, height 5\' 9"  (1.753 m), weight 98.9 kg, SpO2 100 %.Body mass index is 32.19 kg/m.  General Appearance: Casual  Eye Contact:  Good  Speech:  Pressured  Volume:  Increased  Mood:  Anxious and Euphoric  Affect:  Inappropriate and Labile  Thought Process:  Disorganized  Orientation:  Full (Time, Place, and Person)  Thought Content:  Illogical, Paranoid Ideation, Rumination and Tangential  Suicidal Thoughts:  No  Homicidal Thoughts:  No  Memory:  Immediate;   Fair Recent;   Fair Remote;   Fair  Judgement:  Fair  Insight:  Fair  Psychomotor Activity:   Increased  Concentration:  Concentration: Poor  Recall:  [employer]enn[employer] Human of Knowledge:  Fair  Language:  Fair  Akathisia:  No  Handed:  Right  AIMS (if indicated):     Assets:  Desire for Improvement Financial Resources/Insurance Housing Physical Health Resilience Social Support  ADL's:  Impaired  Cognition:  Impaired,  Mild  Sleep:  Number of Hours: 1    Treatment Plan Summary: Daily contact with patient to assess and evaluate symptoms and progress in treatment, Medication management and Plan Continue 15-minute checks.  Restart Depakote with an increased dose to 1500 mg at night which the patient said had previously been his dosage.  Also add Seroquel 100 mg at night for bipolar mania and sleep.  I discussed lithium with him but for now it seems like we will prefer to go with the Depakote.  I spoke with his wife and reassured her that we will be working on treatment as soon as possible.  At this point does not seem to obviously need alcohol withdrawal but will probably need that addressed as part of discharge planning  Observation Level/Precautions:  15 minute checks  Laboratory:  Chemistry Profile  Psychotherapy:    Medications:    Consultations:    Discharge Concerns:    Estimated LOS:  Other:     Physician Treatment Plan for Primary Diagnosis: Bipolar 1 disorder with moderate mania (HCC) Long Term Goal(s): Improvement in symptoms so as ready for discharge  Short Term Goals: Ability to verbalize feelings will improve, Ability to demonstrate self-control will improve and Ability to identify and develop effective coping behaviors will improve  Physician Treatment Plan for Secondary Diagnosis: Principal Problem:   Bipolar 1 disorder with moderate mania (HCC) Active Problems:   Syncope and collapse   Bipolar 1 disorder (HCC)   Seizures (HCC)   Alcohol abuse  Long Term Goal(s): Improvement in symptoms so as ready for discharge  Short Term Goals: Ability to maintain clinical  measurements within normal limits will improve and Compliance with prescribed medications will improve  I certify that inpatient services furnished can reasonably be expected to improve the patient's condition.    Mordecai Rasmussen, MD 7/2/20216:45 PM

## 2020-01-01 NOTE — Progress Notes (Signed)
Recreation Therapy Notes  INPATIENT RECREATION THERAPY ASSESSMENT  Patient Details Name: Jon Ramos MRN: [id number] DOB: 02-14-1966 Today's Date: 01/01/2020       Information Obtained From: Patient  Able to Participate in Assessment/Interview: Yes  Patient Presentation: Responsive, Hyperverbal  Reason for Admission (Per Patient): Active Symptoms, Med Non-Compliance  Patient Stressors:    Coping Skills:   Talk, Prayer, Read  Leisure Interests (2+):  Social - Family, Social - Friends, Technical brewer - Art therapist gardening, Technical brewer - Vegetable gardening  Frequency of Recreation/Participation: Chief Executive Officer of Community Resources:  Yes  Community Resources:  Park  Current Use:    If no, Barriers?:    Expressed Interest in [employer] Information:    [place] of Residence:  [place]  Patient Main Form of Transportation: Set designer  Patient Strengths:  Talking to others  Patient Identified Areas of Improvement:  Getting sleep  Patient Goal for Hospitalization:  Getting back home to my wife  Current SI (including self-harm):  No  Current HI:  No  Current AVH: No  Staff Intervention Plan: Collaborate with Interdisciplinary Treatment Team, Group Attendance  Consent to Intern Participation: N/A  Jon Ramos 01/01/2020, 1:48 PM

## 2020-01-02 MED ORDER — LORAZEPAM 2 MG PO TABS
2.0000 mg | ORAL_TABLET | Freq: Once | ORAL | Status: AC
  Administered 2020-01-03: 2 mg via ORAL

## 2020-01-02 NOTE — Plan of Care (Signed)
  Problem: Education: Goal: Knowledge of [place] [facility] Education information/materials will improve Outcome: Not Progressing Goal: Emotional status will improve Outcome: Not Progressing Goal: Mental status will improve Outcome: Not Progressing Goal: Verbalization of understanding the information provided will improve Outcome: Not Progressing   Problem: Safety: Goal: Periods of time without injury will increase Outcome: Not Progressing

## 2020-01-02 NOTE — [facility] Group Notes (Signed)
LCSW Group Therapy Note  01/02/2020   1:15 PM- 2:15 PM  Type of Therapy and Topic:  Group Therapy: Anger Cues and Responses  Participation Level:  Active   Description of Group:   In this group, patients learned how to recognize the physical, cognitive, emotional, and behavioral responses they have to anger-provoking situations.  They identified a recent time they became angry and how they reacted.  They analyzed how their reaction was possibly beneficial and how it was possibly unhelpful.  The group discussed a variety of healthier coping skills that could help with such a situation in the future.  Focus was placed on how helpful it is to recognize the underlying emotions to our anger, because working on those can lead to a more permanent solution as well as our ability to focus on the important rather than the urgent.  Therapeutic Goals: 1. Patients will remember their last incident of anger and how they felt emotionally and physically, what their thoughts were at the time, and how they behaved. 2. Patients will identify how their behavior at that time worked for them, as well as how it worked against them. 3. Patients will explore possible new behaviors to use in future anger situations. 4. Patients will learn that anger itself is normal and cannot be eliminated, and that healthier reactions can assist with resolving conflict rather than worsening situations.  Summary of Patient Progress:  The patient was overly talkative and verbose throughout group.  His speech is tangential and pressured.  He attempted to take control over the group and became irritable when limits were put into place. This was largely unsuccessful as he tried to drive the direction of conversation by offering "feedback" to every peer who shared. Therapeutic Modalities:   Cognitive Behavioral Therapy  Evorn Gong

## 2020-01-02 NOTE — Progress Notes (Signed)
[facility] MD Progress Note  01/02/2020 1:17 PM Jon Ramos  MRN:  [id number] Subjective:  I have a lot of issues  Principal Problem: Bipolar 1 disorder with moderate mania (HCC) Diagnosis: Principal Problem:   Bipolar 1 disorder with moderate mania (HCC) Active Problems:   Syncope and collapse   Bipolar 1 disorder (HCC)   Seizures (HCC)   Alcohol abuse  Total Time spent with patient:    15-20  Past Psychiatric History:  Outpatient mgt  Recently had stopped meds   Past Medical History:  Past Medical History:  Diagnosis Date  . Bipolar 1 disorder (HCC)   . Hypertension    History reviewed. No pertinent surgical history. Family History: History reviewed. No pertinent family history. Family Psychiatric  History:  Already noted  Social History:  Social History   Substance and Sexual Activity  Alcohol Use No     Social History   Substance and Sexual Activity  Drug Use No    Social History   Socioeconomic History  . Marital status: Married    Spouse name: Not on file  . Number of children: Not on file  . Years of education: Not on file  . Highest education level: Not on file  Occupational History  . Not on file  Tobacco Use  . Smoking status: Never Smoker  . Smokeless tobacco: Never Used  Substance and Sexual Activity  . Alcohol use: No  . Drug use: No  . Sexual activity: Not on file  Other Topics Concern  . Not on file  Social History Narrative  . Not on file   Social Determinants of Health   Financial Resource Strain:   . Difficulty of Paying Living Expenses:   Food Insecurity:   . Worried About Programme researcher, broadcasting/film/video in the Last Year:   . Barista in the Last Year:   Transportation Needs:   . Freight forwarder (Medical):   Marland Kitchen Lack of Transportation (Non-Medical):   Physical Activity:   . Days of Exercise per Week:   . Minutes of Exercise per Session:   Stress:   . Feeling of Stress :   Social Connections:   . Frequency of Communication with  Friends and Family:   . Frequency of Social Gatherings with Friends and Family:   . Attends Religious Services:   . Active Member of Clubs or Organizations:   . Attends Banker Meetings:   Marland Kitchen Marital Status:    Additional Social History:     Patient is very strange with formal thought disorder --very circumstantial, tangential, speech pressured, over inclusive   It is challenging to break away as he is intrusive and overbearing  Awaits --effects of seroquel and depakote   Redirected his complaints to staff about how he has been treated in system over last 7 days he says                       Sleep:  Better with seroquel   Appetite:  Normal   Current Medications: Current Facility-Administered Medications  Medication Dose Route Frequency Provider Last Rate Last Admin  . acetaminophen (TYLENOL) tablet 650 mg  650 mg Oral Q6H PRN Gillermo Murdoch, NP   650 mg at 01/02/20 0804  . alum & mag hydroxide-simeth (MAALOX/MYLANTA) 200-200-20 MG/5ML suspension 30 mL  30 mL Oral Q4H PRN Gillermo Murdoch, NP      . divalproex (DEPAKOTE ER) 24 hr tablet 1,500 mg  1,500 mg  Oral QHS Clapacs, Jackquline Denmark, MD   1,500 mg at 01/01/20 2134  . magnesium hydroxide (MILK OF MAGNESIA) suspension 30 mL  30 mL Oral Daily PRN Gillermo Murdoch, NP      . QUEtiapine (SEROQUEL) tablet 100 mg  100 mg Oral QHS Clapacs, Jackquline Denmark, MD   100 mg at 01/01/20 2134  . traZODone (DESYREL) tablet 50 mg  50 mg Oral QHS PRN Gillermo Murdoch, NP   50 mg at 01/01/20 2134    Lab Results: No results found for this or any previous visit (from the past 48 hour(s)).  Blood Alcohol level:  Lab Results  Component Value Date   ETH <10 12/24/2019    Metabolic Disorder Labs: No results found for: HGBA1C, MPG No results found for: PROLACTIN No results found for: CHOL, TRIG, HDL, CHOLHDL, VLDL, LDLCALC  Physical Findings: AIMS:  , ,  ,  ,    CIWA:    COWS:     Musculoskeletal: Strength & Muscle  Tone:  No new change  Gait & Station:  Normal  Patient leans:  N.a   Psychiatric Specialty Exam: Physical Exam  Review of Systems  Blood pressure (!) 127/92, pulse (!) 103, temperature (!) 97.5 F (36.4 C), temperature source Oral, resp. rate 18, height 5\' 9"  (1.753 m), weight 98.9 kg, SpO2 98 %.Body mass index is 32.19 kg/m.    Mental Status   Very odd and strange Rapport poor Eye contact intense  Oriented to person place date and time  Consciousness not clouded or fluctuant Concentration and attention okay  Mood somewhat blunted Affect --strange --somewhat restricted Speech --pressured, hard to disengage and break away Thought process and content --illogical does not make sense at times, circumstantial tangential --- SI and HI --none  Contracts for safety  Movements --no shakes tremors tics or other movements  Fund of knowledge and intelligence fairly normal  Judgement insight reliability fair to poor Memory remote and recent and immediate intact through general questions Cognition --confused Assets --not clear at this time Sleep --improving                                                            Treatment Plan Summary:  Patient remains ill --awaits stabilization of difficult thought relapse after stopping his meds    , MD 01/02/2020, 1:17 PM

## 2020-01-02 NOTE — Progress Notes (Signed)
Patient is engaging with staff at nurses station to report problems with stay while at [facility] cone. Patient begins to become aggregative and aggressive with statements towards staff. Patient states "and that is why I am not punching you in the face right now." Patient is immediately redirected, and instructed that verbal threats of violence will not be tolerated towards staff members. Patients reports "I didn't threaten you, and I have the evidence to back that up in court." Again patient is redirected and instructed to vacate nurses station area and conversation is ended. Patient agrees to instructions and leave nurses station area and reports to dayroom for lunch.

## 2020-01-02 NOTE — Progress Notes (Signed)
Pt is alert and oriented to person, place, time and situation. Pt is intrusive, demanding, negativistic, hyper-talkative, tangential, requires some redirections, discussed with pt some coping skills. Pt reports he feels irritable. Pt reports sleeping well, is medication compliant, denies suicidal and homicidal ideation, denies hallucinations, denies feelings of depression and anxiety. Pt is social with staff and peers, is noted to be out for meals, and uses the telephone. Will continue to monitor pt per Q15 minute face checks and monitor for safety and progress.

## 2020-01-02 NOTE — Progress Notes (Signed)
Patient has been intrusive and demanding of staff's attention. Speech is loose and tangential. Difficult to redirect. Asked for his medication early because he said he had permission from his physician to take his medication early. He has been redirected to go back to bed several times and interrupts when staff is caring for other patients. He followed this author to the door to the staff restroom and was attempting to carry on a conversation through the bathroom door and became irate when MHT's redirected him away from the bathroom door and told him that his behavior was inappropriate. I reiterated that his behavior was inappropriate and his conversation became nonsensical. Patient redirected back to bed and he asked for a snack and was provided with goldfish crackers. Order obtained for Ativan 2 mg po x1 and then reassess patient's response.

## 2020-01-03 MED ORDER — DIPHENHYDRAMINE HCL 50 MG/ML IJ SOLN: 50.0000 mg | Freq: Four times a day (QID) | INTRAMUSCULAR | Status: DC | PRN

## 2020-01-03 MED ORDER — LORAZEPAM 2 MG/ML IJ SOLN: 2.0000 mg | Freq: Four times a day (QID) | INTRAMUSCULAR | Status: DC

## 2020-01-03 MED ORDER — HALOPERIDOL 5 MG PO TABS
5.0000 mg | ORAL_TABLET | Freq: Four times a day (QID) | ORAL | Status: DC | PRN
  Administered 2020-01-05: 5 mg via ORAL
  Filled 2020-01-03 (×2): qty 1

## 2020-01-03 MED ORDER — LORAZEPAM 2 MG PO TABS
2.0000 mg | ORAL_TABLET | Freq: Four times a day (QID) | ORAL | Status: DC | PRN
  Administered 2020-01-07: 2 mg via ORAL
  Filled 2020-01-03 (×3): qty 1

## 2020-01-03 MED ORDER — HALOPERIDOL LACTATE 5 MG/ML IJ SOLN: 5.0000 mg | Freq: Four times a day (QID) | INTRAMUSCULAR | Status: DC | PRN

## 2020-01-03 NOTE — [facility] Counselor (Signed)
[place] LCSW Note  01/03/2020  1100 Type of Contact and Topic:  PSA Attempt    CSW met with pt in attempts to complete PSA. Pt presented increasingly manic and requested CSW to return at a later time following pt's ability to meet with MD. Pt requested CSW to note of his attempts to manage panic attack during attempts to complete PSA consultation.    TARENCE SEARCY, LCSW 01/03/2020  11:08 AM

## 2020-01-03 NOTE — [facility] Group Notes (Signed)
[facility] Group Notes: (Clinical Social Work)   01/03/2020      Type of Therapy:  Group Therapy   Participation Level:  Did Not Attend - was invited individually by Nurse/MHT and chose not to attend.   IRL BODIE, LCSW 01/03/2020  2:22 PM

## 2020-01-03 NOTE — [facility] Counselor (Signed)
[place] LCSW Note  01/03/2020  1500  Type of Contact and Topic:  PSA Attempt  CSW made additional efforts to complete PSA with patient after pt proved to have met with MD. Pt continued to be increasingly manic, irritable, paranoid, and pressured. Pt exhibited nonsensical and tangential speech and thought content. Pt requested to meet with MD early tomorrow morning and stated he would be more receptive to meeting with CSW after having connected with MD.    Blane Ohara, LCSW 01/03/2020  4:09 PM

## 2020-01-03 NOTE — Progress Notes (Signed)
[facility] MD Progress Note  01/03/2020 1:31 PM Jon Ramos  MRN:  [id number] Subjective:  Patient is currently very ill and decompensating  Principal Problem: Bipolar 1 disorder with moderate mania (HCC) Diagnosis: Principal Problem:   Bipolar 1 disorder with moderate mania (HCC) Active Problems:   Syncope and collapse   Bipolar 1 disorder (HCC)   Seizures (HCC)   Alcohol abuse  Total Time spent with patient:  25-35   Past Psychiatric History:   Past Medical History:  Past Medical History:  Diagnosis Date  . Bipolar 1 disorder (HCC)   . Hypertension    History reviewed. No pertinent surgical history. Family History: History reviewed. No pertinent family history. Family Psychiatric  History:  Social History:  Social History   Substance and Sexual Activity  Alcohol Use No     Social History   Substance and Sexual Activity  Drug Use No    Social History   Socioeconomic History  . Marital status: Married    Spouse name: Not on file  . Number of children: Not on file  . Years of education: Not on file  . Highest education level: Not on file  Occupational History  . Not on file  Tobacco Use  . Smoking status: Never Smoker  . Smokeless tobacco: Never Used  Substance and Sexual Activity  . Alcohol use: No  . Drug use: No  . Sexual activity: Not on file  Other Topics Concern  . Not on file  Social History Narrative  . Not on file   Social Determinants of Health   Financial Resource Strain:   . Difficulty of Paying Living Expenses:   Food Insecurity:   . Worried About Programme researcher, broadcasting/film/video in the Last Year:   . Barista in the Last Year:   Transportation Needs:   . Freight forwarder (Medical):   Marland Kitchen Lack of Transportation (Non-Medical):   Physical Activity:   . Days of Exercise per Week:   . Minutes of Exercise per Session:   Stress:   . Feeling of Stress :   Social Connections:   . Frequency of Communication with Friends and Family:   . Frequency  of Social Gatherings with Friends and Family:   . Attends Religious Services:   . Active Member of Clubs or Organizations:   . Attends Banker Meetings:   Marland Kitchen Marital Status:    Additional Social History:           Patient is very ill ---very psychotic and manic and anxious   PRN benadryl, ativan and haldol ordered today  He is not making sense, has thought blocking is disorganized pressured and activated.  He is anxious and paranoid --he is very ill at this time needing structured support as well.               Sleep:  Poor   Appetite:   Poor   Current Medications: Current Facility-Administered Medications  Medication Dose Route Frequency Provider Last Rate Last Admin  . acetaminophen (TYLENOL) tablet 650 mg  650 mg Oral Q6H PRN Gillermo Murdoch, NP   650 mg at 01/02/20 0804  . alum & mag hydroxide-simeth (MAALOX/MYLANTA) 200-200-20 MG/5ML suspension 30 mL  30 mL Oral Q4H PRN Gillermo Murdoch, NP      . diphenhydrAMINE (BENADRYL) injection 50 mg  50 mg Intramuscular Q6H PRN Roselind Messier, MD      . divalproex (DEPAKOTE ER) 24 hr tablet 1,500 mg  1,500 mg  Oral QHS Clapacs, Jackquline Denmark, MD   1,500 mg at 01/02/20 2006  . haloperidol (HALDOL) tablet 5 mg  5 mg Oral Q6H PRN Roselind Messier, MD       Or  . haloperidol lactate (HALDOL) injection 5 mg  5 mg Intramuscular Q6H PRN Roselind Messier, MD      . LORazepam (ATIVAN) injection 2 mg  2 mg Intramuscular Q6H Roselind Messier, MD      . LORazepam (ATIVAN) tablet 2 mg  2 mg Oral Once Dixon, Rashaun M, NP      . LORazepam (ATIVAN) tablet 2 mg  2 mg Oral Q6H PRN Roselind Messier, MD      . magnesium hydroxide (MILK OF MAGNESIA) suspension 30 mL  30 mL Oral Daily PRN Gillermo Murdoch, NP      . QUEtiapine (SEROQUEL) tablet 100 mg  100 mg Oral QHS Clapacs, Jackquline Denmark, MD   100 mg at 01/02/20 2007  . traZODone (DESYREL) tablet 50 mg  50 mg Oral QHS PRN Gillermo Murdoch, NP   50 mg at 01/02/20 2007    Lab  Results: No results found for this or any previous visit (from the past 48 hour(s)).  Blood Alcohol level:  Lab Results  Component Value Date   ETH <10 12/24/2019    Metabolic Disorder Labs: No results found for: HGBA1C, MPG No results found for: PROLACTIN No results found for: CHOL, TRIG, HDL, CHOLHDL, VLDL, LDLCALC  Physical Findings: AIMS:  , ,  ,  ,    CIWA:    COWS:     Musculoskeletal: Strength & Muscle Tone:  Same  Gait & Station:  Same  Patient leans:     He is grossly psychotic Oriented to name and place  And manic and anxious He is not making sense he is in unreality and is saying many delusional things --he is not making sense --- He is oriented and not clouded or fluctuant  No active SI or hi however   Prn Meds ordered along with structured support      Psychiatric Specialty Exam: Physical Exam  Review of Systems  Blood pressure (!) 127/92, pulse (!) 103, temperature (!) 97.5 F (36.4 C), temperature source Oral, resp. rate 18, height 5\' 9"  (1.753 m), weight 98.9 kg, SpO2 98 %.Body mass index is 32.19 kg/m.  Treatment Plan Summary:   q6hr prn medicines ordered   Staff to assist with this now.     , MD 01/03/2020, 1:31 PM

## 2020-01-03 NOTE — Progress Notes (Signed)
Pt is alert and oriented to person, place, time and situation. Pt has been hyper-talkative, intrusive, interruptive, is hyperactive, requires frequent redirections, several peers on unit complained to staff regarding pt being intrusive and interruptive and hyper-talkative intruding into their conversations when they are talking with one another. Pt lacks insight into this behavior and is not receptive to staff when staff attempt to help pt gain some insight by therapeutically shedding light onto some of his behaviors and how they might make some of his peers feel, and behaviors that some of his peers have complained about, pt denies that those behaviors exist and blames others. Pt has one pt on the unit that pt interacts with the most, pt states, "He's my friend." Pt has been spending a great deal of time in the dayroom talking to that pt, and that peer will listen for long periods of time without speaking. Other than pt being hyperverbal with that peer, and that peer listening in silence for hours, no inappropriate content noted. Pt denies suicidal and homicidal ideation, denies hallucinations, denies feelings of depression and anxiety. Pt reports he slept well, has no pain, reports "my mental health is improving." No distress is noted, none reported. Will continue to monitor pt per Q15 minute face checks and monitor for safety and progress.

## 2020-01-04 MED ORDER — QUETIAPINE FUMARATE 200 MG PO TABS
200.0000 mg | ORAL_TABLET | Freq: Every day | ORAL | Status: DC
  Administered 2020-01-04: 200 mg via ORAL
  Filled 2020-01-04: qty 1

## 2020-01-04 NOTE — Progress Notes (Signed)
Recreation Therapy Notes   Date: 01/04/2020  Time: 9:30 am  Location: Craft room   Behavioral response: Appropriate  Intervention Topic: Necessities   Discussion/Intervention:  Group content on today was focused on necessities. The group defined necessities and how they determine their necessities. Individuals expressed how many necessities they have and if it changes from day to day. Patients described the difference between wants and needs. The group explained how they have overspent on wants in the past. Individuals described a reoccurring necessity for them. The intervention "What I need" helped patients differentiate between wants and needs.  Clinical Observations/Feedback:  Patient came to group late due to unknown reasons and was focused on what peers and staff had to say about necessities. Individual was actively social with peers and staff while participating in the intervention.  [name] LRT/CTRS          [name] 01/04/2020 10:24 AM

## 2020-01-04 NOTE — [facility] Group Notes (Signed)
LCSW Group Therapy Note   01/04/2020 1:55 PM  Type of Therapy and Topic:  Group Therapy:  Overcoming Obstacles   Participation Level:  Did Not Attend   Description of Group:    In this group patients will be encouraged to explore what they see as obstacles to their own wellness and recovery. They will be guided to discuss their thoughts, feelings, and behaviors related to these obstacles. The group will process together ways to cope with barriers, with attention given to specific choices patients can make. Each patient will be challenged to identify changes they are motivated to make in order to overcome their obstacles. This group will be process-oriented, with patients participating in exploration of their own experiences as well as giving and receiving support and challenge from other group members.   Therapeutic Goals: 1. Patient will identify personal and current obstacles as they relate to admission. 2. Patient will identify barriers that currently interfere with their wellness or overcoming obstacles.  3. Patient will identify feelings, thought process and behaviors related to these barriers. 4. Patient will identify two changes they are willing to make to overcome these obstacles:      Summary of Patient Progress X   Therapeutic Modalities:   Cognitive Behavioral Therapy Solution Focused Therapy Motivational Interviewing Relapse Prevention Therapy  Penni Homans, MSW, LCSW 01/04/2020 1:55 PM

## 2020-01-04 NOTE — [facility] Suicide Risk Assessment (Signed)
[facility] INPATIENT:  Family/Significant Other Suicide Prevention Education  Suicide Prevention Education:  Education Completed; Jon Ramos, wife, [phone], has been identified by the patient as the family member/significant other with whom the patient will be residing, and identified as the person(s) who will aid the patient in the event of a mental health crisis (suicidal ideations/suicide attempt).  With written consent from the patient, the family member/significant other has been provided the following suicide prevention education, prior to the and/or following the discharge of the patient.  The suicide prevention education provided includes the following:  Suicide risk factors  Suicide prevention and interventions  National Suicide Hotline telephone number  [facility] assessment telephone number  [facility] Emergency Assistance 911  [facility] and/or Residential Mobile Crisis Unit telephone number  Request made of family/significant other to:  Remove weapons (e.g., guns, rifles, knives), all items previously/currently identified as safety concern.    Remove drugs/medications (over-the-counter, prescriptions, illicit drugs), all items previously/currently identified as a safety concern.  The family member/significant other verbalizes understanding of the suicide prevention education information provided.  The family member/significant other agrees to remove the items of safety concern listed above.  Wife reports that the patient had his first mani episode in his early 42s.  She reports that recently "maybe 4 weeks to months" pt stopped taking his Depakote.  She reports that as a result the patient "went into a intense manic high and had a seizure".  She reports that the patient is not likely a harm to others but may be a harm to self "in a sense that he might drink and get behind the wheel of a car".  She reports that patient has struggled with alcohol in the  past.  She reports that patient does not have access to weapons.    She reports a belief that patient is experiencing "cell phone withdrawal" and :manifesting work by carrying his items around and being intrusive with the staff and other patients".  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 She reports that the patients job is often a "source of great stress".    Harden Mo 01/04/2020, 2:52 PM

## 2020-01-04 NOTE — Progress Notes (Signed)
Patient is continues to be hyper verbal and could not make a logical conversation.Patient is complaining that he did not get a good orientation to the unit and the unit programs.Writer told patient about the admission packet and to come and staff for any questions.Patient is trying to explain how he is improving with his sleep with a diagram.Patient is receptive with redirection.No aggressive behaviors noted.Denies SI,HI and AVH.Attended groups.Appetite and energy level good.Support and encouragement given.

## 2020-01-04 NOTE — Progress Notes (Signed)
[facility] MD Progress Note  01/04/2020 1:56 PM Jon Ramos  MRN:  [id number] Subjective: Follow-up for this 54 year old man with bipolar disorder with mania.  Patient seen chart reviewed.  Over the weekend evidently the patient was frequently seen as hyperverbal and intrusive and at times disruptive.  On interview today the patient is still hyperverbal somewhat confused and paranoid.  He is fixated on the idea that he believes he had run-ins with staff and seems to have some complaints about how they were handled although he is very vague about it.  He is not currently threatening or agitated but his insight is partial at best. Principal Problem: Bipolar 1 disorder with moderate mania (HCC) Diagnosis: Principal Problem:   Bipolar 1 disorder with moderate mania (HCC) Active Problems:   Syncope and collapse   Bipolar 1 disorder (HCC)   Seizures (HCC)   Alcohol abuse  Total Time spent with patient: 30 minutes  Past Psychiatric History: Patient has a history of bipolar disorder with previous psychotic episodes although it is been a long time evidently since his last hospital stay  Past Medical History:  Past Medical History:  Diagnosis Date  . Bipolar 1 disorder (HCC)   . Hypertension    History reviewed. No pertinent surgical history. Family History: History reviewed. No pertinent family history. Family Psychiatric  History: See previous Social History:  Social History   Substance and Sexual Activity  Alcohol Use No     Social History   Substance and Sexual Activity  Drug Use No    Social History   Socioeconomic History  . Marital status: Married    Spouse name: Not on file  . Number of children: Not on file  . Years of education: Not on file  . Highest education level: Not on file  Occupational History  . Not on file  Tobacco Use  . Smoking status: Never Smoker  . Smokeless tobacco: Never Used  Substance and Sexual Activity  . Alcohol use: No  . Drug use: No  . Sexual  activity: Not on file  Other Topics Concern  . Not on file  Social History Narrative  . Not on file   Social Determinants of Health   Financial Resource Strain:   . Difficulty of Paying Living Expenses:   Food Insecurity:   . Worried About Programme researcher, broadcasting/film/video in the Last Year:   . Barista in the Last Year:   Transportation Needs:   . Freight forwarder (Medical):   Marland Kitchen Lack of Transportation (Non-Medical):   Physical Activity:   . Days of Exercise per Week:   . Minutes of Exercise per Session:   Stress:   . Feeling of Stress :   Social Connections:   . Frequency of Communication with Friends and Family:   . Frequency of Social Gatherings with Friends and Family:   . Attends Religious Services:   . Active Member of Clubs or Organizations:   . Attends Banker Meetings:   Marland Kitchen Marital Status:    Additional Social History:                         Sleep: Fair  Appetite:  Fair  Current Medications: Current Facility-Administered Medications  Medication Dose Route Frequency Provider Last Rate Last Admin  . acetaminophen (TYLENOL) tablet 650 mg  650 mg Oral Q6H PRN Gillermo Murdoch, NP   650 mg at 01/02/20 0804  . alum & mag  hydroxide-simeth (MAALOX/MYLANTA) 200-200-20 MG/5ML suspension 30 mL  30 mL Oral Q4H PRN Gillermo Murdoch, NP      . diphenhydrAMINE (BENADRYL) injection 50 mg  50 mg Intramuscular Q6H PRN Roselind Messier, MD      . divalproex (DEPAKOTE ER) 24 hr tablet 1,500 mg  1,500 mg Oral QHS Terrick Allred T, MD   1,500 mg at 01/03/20 2130  . haloperidol (HALDOL) tablet 5 mg  5 mg Oral Q6H PRN Roselind Messier, MD       Or  . haloperidol lactate (HALDOL) injection 5 mg  5 mg Intramuscular Q6H PRN Roselind Messier, MD      . LORazepam (ATIVAN) tablet 2 mg  2 mg Oral Q6H PRN Roselind Messier, MD      . magnesium hydroxide (MILK OF MAGNESIA) suspension 30 mL  30 mL Oral Daily PRN Gillermo Murdoch, NP      . QUEtiapine (SEROQUEL)  tablet 200 mg  200 mg Oral QHS Polo Mcmartin T, MD      . traZODone (DESYREL) tablet 50 mg  50 mg Oral QHS PRN Gillermo Murdoch, NP   50 mg at 01/02/20 2007    Lab Results: No results found for this or any previous visit (from the past 48 hour(s)).  Blood Alcohol level:  Lab Results  Component Value Date   ETH <10 12/24/2019    Metabolic Disorder Labs: No results found for: HGBA1C, MPG No results found for: PROLACTIN No results found for: CHOL, TRIG, HDL, CHOLHDL, VLDL, LDLCALC  Physical Findings: AIMS:  , ,  ,  ,    CIWA:    COWS:     Musculoskeletal: Strength & Muscle Tone: within normal limits Gait & Station: normal Patient leans: N/A  Psychiatric Specialty Exam: Physical Exam Vitals and nursing note reviewed.  Constitutional:      Appearance: He is well-developed.  HENT:     Head: Normocephalic and atraumatic.  Eyes:     Conjunctiva/sclera: Conjunctivae normal.     Pupils: Pupils are equal, round, and reactive to light.  Cardiovascular:     Heart sounds: Normal heart sounds.  Pulmonary:     Effort: Pulmonary effort is normal.  Abdominal:     Palpations: Abdomen is soft.  Musculoskeletal:        General: Normal range of motion.     Cervical back: Normal range of motion.  Skin:    General: Skin is warm and dry.  Neurological:     General: No focal deficit present.     Mental Status: He is alert.  Psychiatric:        Attention and Perception: Attention normal.        Mood and Affect: Mood is anxious. Affect is labile.        Speech: Speech is rapid and pressured.        Behavior: Behavior is agitated. Behavior is not aggressive.        Thought Content: Thought content is paranoid. Thought content does not include homicidal or suicidal ideation.        Cognition and Memory: Cognition is impaired. Memory is impaired.        Judgment: Judgment is impulsive.     Review of Systems  Constitutional: Negative.   HENT: Negative.   Eyes: Negative.    Respiratory: Negative.   Cardiovascular: Negative.   Gastrointestinal: Negative.   Musculoskeletal: Negative.   Skin: Negative.   Neurological: Negative.   Psychiatric/Behavioral: Positive for confusion.    Blood pressure Marland Kitchen)  140/100, pulse 98, temperature 98.3 F (36.8 C), temperature source Oral, resp. rate 18, height 5\' 9"  (1.753 m), weight 98.9 kg, SpO2 99 %.Body mass index is 32.19 kg/m.  General Appearance: Casual  Eye Contact:  Fair  Speech:  Pressured  Volume:  Increased  Mood:  Irritable  Affect:  Congruent  Thought Process:  Disorganized  Orientation:  Full (Time, Place, and Person)  Thought Content:  Illogical, Paranoid Ideation, Rumination and Tangential  Suicidal Thoughts:  No  Homicidal Thoughts:  No  Memory:  Immediate;   Fair Recent;   Poor Remote;   Fair  Judgement:  Impaired  Insight:  Shallow  Psychomotor Activity:  Increased  Concentration:  Concentration: Fair  Recall:  of Knowledge:  Fair  Language:  Fair  Akathisia:  No  Handed:  Right  AIMS (if indicated):     Assets:  Desire for Improvement Financial Resources/Insurance Housing Physical Health Social Support  ADL's:  Intact  Cognition:  WNL  Sleep:  Number of Hours: 7     Treatment Plan Summary: Daily contact with patient to assess and evaluate symptoms and progress in treatment, Medication management and Plan Patient still appears to be manic and almost certainly nowhere near his baseline.  Very irrational with trouble functioning or thinking logically.  Medicine reviewed.  We will check a Depakote level tomorrow morning.  Increase Seroquel to 200 mg at night.  Discontinued the standing Ativan which would not be reasonable in the long-term but does leave the as needed available as needed.  [employer], MD 01/04/2020, 1:56 PM
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
## 2020-01-04 NOTE — BHH Counselor (Signed)
Adult Comprehensive Assessment  Patient ID: Jon Ramos, male   DOB: Oct 04, 1965, 54 y.o.   MRN: [id number]  Information Source: Information source: Patient  Current Stressors:  Patient states their primary concerns and needs for treatment are:: "help with Bipolar I/Generalized Anxiety Disorder and trying to get some rest" Patient states their goals for this hospitilization and ongoing recovery are:: "get in front of [employer]" Educational / Learning stressors: Pt denies. Employment / Job issues: Pt denies. Family Relationships: Pt denies. Financial / Lack of resources (include bankruptcy): Pt denies. Housing / Lack of housing: Pt denies. Physical health (include injuries & life threatening diseases): Pt denies. Social relationships: "I have a group of friends and we vacation every year in [place], this year I was not invited" Substance abuse: "the occassional CBD product" Bereavement / Loss: "my mother in law"  Living/Environment/Situation:  Living Arrangements: Spouse/significant other, Other relatives Who else lives in the home?: "Father in law, wife" How long has patient lived in current situation?: "since 2008" What is atmosphere in current home: Comfortable, Paramedic, Supportive  Family History:  Marital status: Married Number of Years Married: 19 Does patient have children?: No  Childhood History:  By whom was/is the patient raised?: Both parents Description of patient's relationship with caregiver when they were a child: "excellent" Patient's description of current relationship with people who raised him/her: "getting better" How were you disciplined when you got in trouble as a child/adolescent?: "not very stringently" Does patient have siblings?: Yes Number of Siblings: 1 Description of patient's current relationship with siblings: "recently it's been getting better" Did patient suffer any verbal/emotional/physical/sexual abuse as a child?: No Did patient suffer from severe  childhood neglect?: No Has patient ever been sexually abused/assaulted/raped as an adolescent or adult?: No Was the patient ever a victim of a crime or a disaster?: No Witnessed domestic violence?: No Has patient been affected by domestic violence as an adult?: No  Education:  Highest grade of school patient has completed: Chief Operating Officer Currently a Consulting civil engineer?: No Learning disability?: No  Employment/Work Situation:   Employment situation: Employed 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, engineering" How long has patient been employed?: "13 years" Patient's job has been impacted by current illness: Yes Describe how patient's job has been impacted: "it's been taking me away from it" What is the longest time patient has a held a job?: current job Has patient ever been in the [employer]?: No  Financial Resources:   Surveyor, quantity resources: Income from employment, Private insurance Does patient have a representative payee or guardian?: No  Alcohol/Substance Abuse:   If attempted suicide, did drugs/alcohol play a role in this?: No Alcohol/Substance Abuse Treatment Hx: Denies past history Has alcohol/substance abuse ever caused legal problems?: No  Social Support System:   Patient's Community Support System: Production assistant, radio System: "neighbors, friends, work Acupuncturist and mainly my wife and father-in-law" Type of faith/religion: "Christianity" How does patient's faith help to cope with current illness?: "prayer and meditation"  Leisure/Recreation:   Do You Have Hobbies?: Yes Leisure and Hobbies: "gardening, reading, writing, learning something new, embroidery"  Strengths/Needs:   What is the patient's perception of their strengths?: "compassionate, empathetic adn an Radiographer, therapeutic" Patient states these barriers may affect/interfere with their treatment: Pt denies. Patient states these barriers may affect their return to the community: Pt denies.  Discharge Plan:    Currently receiving community mental health services: No Patient states concerns and preferences for aftercare planning are: Pt reports that he is open to outpatient referral.  Patient states they will know when they are safe and ready for discharge when: "I already feel I am" Does patient have access to transportation?: Yes Does patient have financial barriers related to discharge medications?: No Will patient be returning to same living situation after discharge?: Yes  Summary/Recommendations:   Summary and Recommendations (to be completed by the evaluator): Patient is a 54 year old married male [facility] [place] [place] [facility]  He presents to the hospital following concerns for Bipolar and hypertension.  He has a primary diagnosis of Bipolar I Disorder.  Recommendations include: crisis stabilization, therapeutic milieu, encourage group attendance and participation, medication management for detox/mood stabilization and development of comprehensive mental wellness/sobriety plan.  Harden Mo. 01/04/2020

## 2020-01-05 LAB — VALPROIC ACID LEVEL: Valproic Acid Lvl: 107 ug/mL — ABNORMAL HIGH (ref 50.0–100.0)

## 2020-01-05 MED ORDER — DIVALPROEX SODIUM ER 500 MG PO TB24
1250.0000 mg | ORAL_TABLET | Freq: Every day | ORAL | Status: DC
  Administered 2020-01-05 – 2020-01-07 (×3): 1250 mg via ORAL
  Filled 2020-01-05 (×4): qty 1

## 2020-01-05 MED ORDER — QUETIAPINE FUMARATE 200 MG PO TABS
300.0000 mg | ORAL_TABLET | Freq: Every day | ORAL | Status: DC
  Administered 2020-01-05 – 2020-01-07 (×3): 300 mg via ORAL
  Filled 2020-01-05 (×3): qty 1

## 2020-01-05 NOTE — Progress Notes (Signed)
Patient pleasant during assessment denying SI/HI/AVH. Patient stated he was feeling better today and didn't have an anxiety attack to day. Patient compliant with medication administration per MD orders. Patient still presents intrusive with staff. Patient given education, support and encouragement to be active in his treatment plan. Patient being monitored Q 15 minutes for safety per unit protocol. Pt remains safe on the unit.

## 2020-01-05 NOTE — Progress Notes (Signed)
[facility] MD Progress Note  01/05/2020 4:15 PM Jon Ramos  MRN:  [id number] Subjective: Follow-up for this patient with bipolar mania.  Patient seen in treatment team and individually.  Communication with this patient is somewhat difficult because he is hyperverbal and preoccupied with reviewing over and over perceived slights on the ward and minor incidents from the past to the point that it is nearly impossible to converse with him directly about his treatment.  He remains hyperverbal and often intrusive with limited insight.  He is compliant with medicine.  Depakote level came back slightly high at 107.  I tried to reach his wife on the telephone today but he tells me that she is at work until 5:00 and she was not able to answer her phone. Principal Problem: Bipolar 1 disorder with moderate mania (HCC) Diagnosis: Principal Problem:   Bipolar 1 disorder with moderate mania (HCC) Active Problems:   Syncope and collapse   Bipolar 1 disorder (HCC)   Seizures (HCC)   Alcohol abuse  Total Time spent with patient: 30 minutes  Past Psychiatric History: Patient has a history of several psychotic episodes in the past but evidently had been stable for an extended period of time  Past Medical History:  Past Medical History:  Diagnosis Date  . Bipolar 1 disorder (HCC)   . Hypertension    History reviewed. No pertinent surgical history. Family History: History reviewed. No pertinent family history. Family Psychiatric  History: See previous.  Only known history is that a maternal grandfather killed himself Social History:  Social History   Substance and Sexual Activity  Alcohol Use No     Social History   Substance and Sexual Activity  Drug Use No    Social History   Socioeconomic History  . Marital status: Married    Spouse name: Not on file  . Number of children: Not on file  . Years of education: Not on file  . Highest education level: Not on file  Occupational History  . Not on file   Tobacco Use  . Smoking status: Never Smoker  . Smokeless tobacco: Never Used  Substance and Sexual Activity  . Alcohol use: No  . Drug use: No  . Sexual activity: Not on file  Other Topics Concern  . Not on file  Social History Narrative  . Not on file   Social Determinants of Health   Financial Resource Strain:   . Difficulty of Paying Living Expenses:   Food Insecurity:   . Worried About Programme researcher, broadcasting/film/video in the Last Year:   . Barista in the Last Year:   Transportation Needs:   . Freight forwarder (Medical):   Marland Kitchen Lack of Transportation (Non-Medical):   Physical Activity:   . Days of Exercise per Week:   . Minutes of Exercise per Session:   Stress:   . Feeling of Stress :   Social Connections:   . Frequency of Communication with Friends and Family:   . Frequency of Social Gatherings with Friends and Family:   . Attends Religious Services:   . Active Member of Clubs or Organizations:   . Attends Banker Meetings:   Marland Kitchen Marital Status:    Additional Social History:                         Sleep: Fair  Appetite:  Fair  Current Medications: Current Facility-Administered Medications  Medication Dose Route Frequency Provider  Last Rate Last Admin  . acetaminophen (TYLENOL) tablet 650 mg  650 mg Oral Q6H PRN Gillermo Murdoch, NP   650 mg at 01/04/20 1434  . alum & mag hydroxide-simeth (MAALOX/MYLANTA) 200-200-20 MG/5ML suspension 30 mL  30 mL Oral Q4H PRN Gillermo Murdoch, NP      . diphenhydrAMINE (BENADRYL) injection 50 mg  50 mg Intramuscular Q6H PRN Roselind Messier, MD      . divalproex (DEPAKOTE ER) 24 hr tablet 1,250 mg  1,250 mg Oral QHS Ayjah Show T, MD      . haloperidol (HALDOL) tablet 5 mg  5 mg Oral Q6H PRN Roselind Messier, MD       Or  . haloperidol lactate (HALDOL) injection 5 mg  5 mg Intramuscular Q6H PRN Roselind Messier, MD      . LORazepam (ATIVAN) tablet 2 mg  2 mg Oral Q6H PRN Roselind Messier, MD       . magnesium hydroxide (MILK OF MAGNESIA) suspension 30 mL  30 mL Oral Daily PRN Gillermo Murdoch, NP      . QUEtiapine (SEROQUEL) tablet 300 mg  300 mg Oral QHS Euva Rundell T, MD      . traZODone (DESYREL) tablet 50 mg  50 mg Oral QHS PRN Gillermo Murdoch, NP   50 mg at 01/02/20 2007    Lab Results:  Results for orders placed or performed during the hospital encounter of 12/31/19 (from the past 48 hour(s))  Valproic acid level     Status: Abnormal   Collection Time: 01/05/20  7:06 AM  Result Value Ref Range   Valproic Acid Lvl 107 (H) 50.0 - 100.0 ug/mL    Comment: Performed at [facility], [address], [place], [place] [address]    Blood Alcohol level:  Lab Results  Component Value Date   [facility] <10 12/24/2019    Metabolic Disorder Labs: No results found for: HGBA1C, MPG No results found for: PROLACTIN No results found for: CHOL, TRIG, HDL, CHOLHDL, VLDL, LDLCALC  Physical Findings: AIMS:  , ,  ,  ,    CIWA:    COWS:     Musculoskeletal: Strength & Muscle Tone: within normal limits Gait & Station: normal Patient leans: N/A  Psychiatric Specialty Exam: Physical Exam Vitals and nursing note reviewed.  Constitutional:      Appearance: He is well-developed.  HENT:     Head: Normocephalic and atraumatic.  Eyes:     Conjunctiva/sclera: Conjunctivae normal.     Pupils: Pupils are equal, round, and reactive to light.  Cardiovascular:     Heart sounds: Normal heart sounds.  Pulmonary:     Effort: Pulmonary effort is normal.  Abdominal:     Palpations: Abdomen is soft.  Musculoskeletal:        General: Normal range of motion.     Cervical back: Normal range of motion.  Skin:    General: Skin is warm and dry.  Neurological:     General: No focal deficit present.     Mental Status: He is alert.  Psychiatric:        Attention and Perception: Attention normal.        Mood and Affect: Mood is anxious. Affect is labile.        Speech: Speech is  rapid and pressured and tangential.        Behavior: Behavior is agitated and hyperactive. Behavior is not aggressive.        Thought Content: Thought content is paranoid. Thought  content does not include homicidal or suicidal ideation.        Judgment: Judgment is inappropriate.     Review of Systems  Constitutional: Negative.   HENT: Negative.   Eyes: Negative.   Respiratory: Negative.   Cardiovascular: Negative.   Gastrointestinal: Negative.   Musculoskeletal: Negative.   Skin: Negative.   Neurological: Negative.   Psychiatric/Behavioral: Positive for confusion and sleep disturbance. The patient is hyperactive.     Blood pressure (!) 124/96, pulse 92, temperature 97.9 F (36.6 C), temperature source Oral, resp. rate 18, height 5\' 9"  (1.753 m), weight 98.9 kg, SpO2 100 %.Body mass index is 32.19 kg/m.  General Appearance: Casual  Eye Contact:  Good  Speech:  Pressured  Volume:  Increased  Mood:  Dysphoric and Irritable  Affect:  Inappropriate and Labile  Thought Process:  Disorganized  Orientation:  Full (Time, Place, and Person)  Thought Content:  Illogical, Paranoid Ideation, Rumination and Tangential  Suicidal Thoughts:  No  Homicidal Thoughts:  No  Memory:  Immediate;   Fair Recent;   Fair Remote;   Fair  Judgement:  Impaired  Insight:  Shallow  Psychomotor Activity:  Restlessness  Concentration:  Concentration: Poor  Recall:  of Knowledge:  Fair  Language:  Fair  Akathisia:  No  Handed:  Right  AIMS (if indicated):     Assets:  Desire for Improvement Housing Physical Health Resilience Social Support  ADL's:  Impaired  Cognition:  Impaired,  Mild  Sleep:  Number of Hours: 7     Treatment Plan Summary: Daily contact with patient to assess and evaluate symptoms and progress in treatment, Medication management and Plan Increased dose of Seroquel to 300 mg tonight.  Decrease dose of Depakote down to 1250 mg because he seemed to be slightly  tremulous earlier today and did have a Depakote level over 100.  Spoke with him about how we would daily monitor his progress before making a discharge decision.  Patient is not actively suicidal and has been compliant and may be ready for discharge even before fully asymptomatic.  [employer], MD 01/05/2020, 4:15 PM

## 2020-01-05 NOTE — Tx Team (Addendum)
Interdisciplinary Treatment and Diagnostic Plan Update  01/05/2020 Time of Session: 9:00AM Jon Ramos MRN: [id number]  Principal Diagnosis: Bipolar 1 disorder with moderate mania (HCC)  Secondary Diagnoses: Principal Problem:   Bipolar 1 disorder with moderate mania (HCC) Active Problems:   Syncope and collapse   Bipolar 1 disorder (HCC)   Seizures (HCC)   Alcohol abuse   Current Medications:  Current Facility-Administered Medications  Medication Dose Route Frequency Provider Last Rate Last Admin  . acetaminophen (TYLENOL) tablet 650 mg  650 mg Oral Q6H PRN Gillermo Murdoch, NP   650 mg at 01/04/20 1434  . alum & mag hydroxide-simeth (MAALOX/MYLANTA) 200-200-20 MG/5ML suspension 30 mL  30 mL Oral Q4H PRN Gillermo Murdoch, NP      . diphenhydrAMINE (BENADRYL) injection 50 mg  50 mg Intramuscular Q6H PRN Roselind Messier, MD      . divalproex (DEPAKOTE ER) 24 hr tablet 1,500 mg  1,500 mg Oral QHS Clapacs, John T, MD   1,500 mg at 01/04/20 2157  . haloperidol (HALDOL) tablet 5 mg  5 mg Oral Q6H PRN Roselind Messier, MD       Or  . haloperidol lactate (HALDOL) injection 5 mg  5 mg Intramuscular Q6H PRN Roselind Messier, MD      . LORazepam (ATIVAN) tablet 2 mg  2 mg Oral Q6H PRN Roselind Messier, MD      . magnesium hydroxide (MILK OF MAGNESIA) suspension 30 mL  30 mL Oral Daily PRN Gillermo Murdoch, NP      . QUEtiapine (SEROQUEL) tablet 200 mg  200 mg Oral QHS Clapacs, Jackquline Denmark, MD   200 mg at 01/04/20 2157  . traZODone (DESYREL) tablet 50 mg  50 mg Oral QHS PRN Gillermo Murdoch, NP   50 mg at 01/02/20 2007   PTA Medications: Medications Prior to Admission  Medication Sig Dispense Refill Last Dose  . amLODipine (NORVASC) 10 MG tablet Take 1 tablet (10 mg total) by mouth daily. 30 tablet 0   . divalproex (DEPAKOTE ER) 500 MG 24 hr tablet Take 1,000 mg by mouth every morning.     . escitalopram (LEXAPRO) 10 MG tablet Take 10 mg by mouth daily.       Patient  Stressors: Loss of mother inlaw Marital or family conflict Medication change or noncompliance  Patient Strengths: Average or above average intelligence Capable of independent living Licensed conveyancer Motivation for treatment/growth Supportive family/friends  Treatment Modalities: Medication Management, Group therapy, Case management,  1 to 1 session with clinician, Psychoeducation, Recreational therapy.   Physician Treatment Plan for Primary Diagnosis: Bipolar 1 disorder with moderate mania (HCC) Long Term Goal(s): Improvement in symptoms so as ready for discharge Improvement in symptoms so as ready for discharge   Short Term Goals: Ability to verbalize feelings will improve Ability to demonstrate self-control will improve Ability to identify and develop effective coping behaviors will improve Ability to maintain clinical measurements within normal limits will improve Compliance with prescribed medications will improve  Medication Management: Evaluate patient's response, side effects, and tolerance of medication regimen.  Therapeutic Interventions: 1 to 1 sessions, Unit Group sessions and Medication administration.  Evaluation of Outcomes: Not Progressing  Physician Treatment Plan for Secondary Diagnosis: Principal Problem:   Bipolar 1 disorder with moderate mania (HCC) Active Problems:   Syncope and collapse   Bipolar 1 disorder (HCC)   Seizures (HCC)   Alcohol abuse  Long Term Goal(s): Improvement in symptoms so as ready for discharge Improvement in symptoms so as  ready for discharge   Short Term Goals: Ability to verbalize feelings will improve Ability to demonstrate self-control will improve Ability to identify and develop effective coping behaviors will improve Ability to maintain clinical measurements within normal limits will improve Compliance with prescribed medications will improve     Medication Management: Evaluate patient's response,  side effects, and tolerance of medication regimen.  Therapeutic Interventions: 1 to 1 sessions, Unit Group sessions and Medication administration.  Evaluation of Outcomes: Not Progressing   RN Treatment Plan for Primary Diagnosis: Bipolar 1 disorder with moderate mania (HCC) Long Term Goal(s): Knowledge of disease and therapeutic regimen to maintain health will improve  Short Term Goals: Ability to demonstrate self-control, Ability to participate in decision making will improve, Ability to verbalize feelings will improve, Ability to identify and develop effective coping behaviors will improve and Compliance with prescribed medications will improve  Medication Management: RN will administer medications as ordered by provider, will assess and evaluate patient's response and provide education to patient for prescribed medication. RN will report any adverse and/or side effects to prescribing provider.  Therapeutic Interventions: 1 on 1 counseling sessions, Psychoeducation, Medication administration, Evaluate responses to treatment, Monitor vital signs and CBGs as ordered, Perform/monitor CIWA, COWS, AIMS and Fall Risk screenings as ordered, Perform wound care treatments as ordered.  Evaluation of Outcomes: Not Progressing   LCSW Treatment Plan for Primary Diagnosis: Bipolar 1 disorder with moderate mania (HCC) Long Term Goal(s): Safe transition to appropriate next level of care at discharge, Engage patient in therapeutic group addressing interpersonal concerns.  Short Term Goals: Engage patient in aftercare planning with referrals and resources, Increase social support, Increase ability to appropriately verbalize feelings, Increase emotional regulation and Increase skills for wellness and recovery  Therapeutic Interventions: Assess for all discharge needs, 1 to 1 time with Social worker, Explore available resources and support systems, Assess for adequacy in community support network, Educate  family and significant other(s) on suicide prevention, Complete Psychosocial Assessment, Interpersonal group therapy.  Evaluation of Outcomes: Not Progressing   Progress in Treatment: Attending groups: Yes. Participating in groups: Yes. Taking medication as prescribed: Yes. Toleration medication: Yes. Family/Significant other contact made: Yes, individual(s) contacted:  SPE completed with the patient's wife.  Patient understands diagnosis: Yes. Discussing patient identified problems/goals with staff: Yes. Medical problems stabilized or resolved: Yes. Denies suicidal/homicidal ideation: Yes. Issues/concerns per patient self-inventory: No. Other: none  New problem(s) identified: No, Describe:  none  New Short Term/Long Term Goal(s): elimination of symptoms of psychosis, medication management for mood stabilization; elimination of SI thoughts; development of comprehensive mental wellness/sobriety plan.  Patient Goals:  "have the doctor tell my wife that I am safe to go home"  Discharge Plan or Barriers: Patient reports plans to pursue aftercare with one particular psychiatrist recommended by a peer.  CSW is awaiting confirmation that the provider is available in [place].  Reason for Continuation of Hospitalization: Anxiety Depression Mania Medication stabilization  Estimated Length of Stay: 1-7 days  Recreational Therapy: Patient: N/A Patient Goal: Patient will engage in groups without prompting or encouragement from LRT x3 group sessions within 5 recreation therapy group sessions  Attendees: Patient: Jon Ramos 01/05/2020 1:06 PM  Physician: Dr. Toni Amend, MD 01/05/2020 1:06 PM  Nursing: Torrie Mayers, RN 01/05/2020 1:06 PM  RN Care Manager: 01/05/2020 1:06 PM  Social Worker: Penni Homans, LCSW 01/05/2020 1:06 PM  Recreational Therapist: Garret Reddish, CTRS, LRT 01/05/2020 1:06 PM  Other: Lowella Dandy, LCSW 01/05/2020 1:06 PM  Other:  01/05/2020 1:06  PM  Other: 01/05/2020 1:06  PM    Scribe for Treatment Team: Harden Mo, LCSW 01/05/2020 1:06 PM

## 2020-01-05 NOTE — Plan of Care (Signed)
Pt rates hopelessness 2/10 and anxiety 2/10. Pt denies SI, HI and AVH. Pt was educated on care plan and verbalizes understanding. Pt was encouraged to attend groups. Torrie Mayers RN  Problem: Education: Goal: Knowledge of [place] [facility] Education information/materials will improve 01/05/2020 1112 by Chalmers Cater, RN Outcome: Not Progressing 01/05/2020 1111 by Chalmers Cater, RN Outcome: Progressing Goal: Emotional status will improve 01/05/2020 1112 by Chalmers Cater, RN Outcome: Not Progressing 01/05/2020 1111 by Chalmers Cater, RN Outcome: Progressing Goal: Mental status will improve 01/05/2020 1112 by Chalmers Cater, RN Outcome: Not Progressing 01/05/2020 1111 by Chalmers Cater, RN Outcome: Progressing Goal: Verbalization of understanding the information provided will improve 01/05/2020 1112 by Chalmers Cater, RN Outcome: Not Progressing 01/05/2020 1111 by Chalmers Cater, RN Outcome: Progressing   Problem: Activity: Goal: Will verbalize the importance of balancing activity with adequate rest periods 01/05/2020 1112 by Chalmers Cater, RN Outcome: Progressing 01/05/2020 1111 by Chalmers Cater, RN Outcome: Progressing   Problem: Safety: Goal: Ability to redirect hostility and anger into socially appropriate behaviors will improve 01/05/2020 1112 by Chalmers Cater, RN Outcome: Not Progressing 01/05/2020 1111 by Chalmers Cater, RN Outcome: Progressing Goal: Ability to remain free from injury will improve 01/05/2020 1112 by Chalmers Cater, RN Outcome: Progressing 01/05/2020 1111 by Chalmers Cater, RN Outcome: Progressing

## 2020-01-05 NOTE — Progress Notes (Signed)
Recreation Therapy Notes   Date: 01/05/2020  Time: 9:30 am  Location: Courtyard    Behavioral response: Appropriate  Intervention Topic: Social skills  Discussion/Intervention:  Group content on today was focused on social skills. The group defined social skills and identified ways they use social skills. Patients expressed what obstacles they face when trying to be social. Participants described the importance of social skills. The group listed ways to improve social skills and reasons to improve social skills. Individuals had an opportunity to learn new and improve social skills as well as identify their weaknesses. Clinical Observations/Feedback:  Patient came to group and was actively social with peers and staff while participating in the intervention.  Vallory Oetken LRT/CTRS         Deavion Strider 01/05/2020 11:01 AM

## 2020-01-05 NOTE — [facility] Counselor (Signed)
CSW attempted for the second day in a row to contact pt's desired referral to determine if they provider in questions available.  CSW left messages both days and is awaiting a return call.   Penni Homans, MSW, LCSW 01/05/2020 1:28 PM

## 2020-01-05 NOTE — Progress Notes (Signed)
   01/05/20 1400  Clinical Encounter Type  Visited With Patient  Visit Type Initial;Behavioral Health;Social support;Spiritual support  Referral From Chaplain  Consult/Referral To Chaplain  Ch held group session in [facility] today. Today's topic was Angry. How do you handle angry. Pt participated in group. Pt stayed after class and talked for a while. Ch will follow-up at later date.

## 2020-01-05 NOTE — Plan of Care (Signed)
  Problem: Education: Goal: Knowledge of [place] [facility] Education information/materials will improve Outcome: Not Progressing Goal: Emotional status will improve Outcome: Not Progressing Goal: Mental status will improve Outcome: Not Progressing Goal: Verbalization of understanding the information provided will improve Outcome: Not Progressing   Problem: Safety: Goal: Periods of time without injury will increase Outcome: Not Progressing   Problem: Activity: Goal: Will verbalize the importance of balancing activity with adequate rest periods Outcome: Not Progressing   Problem: Safety: Goal: Ability to redirect hostility and anger into socially appropriate behaviors will improve Outcome: Not Progressing Goal: Ability to remain free from injury will improve Outcome: Not Progressing

## 2020-01-05 NOTE — [facility] Group Notes (Signed)
Balance In Life 01/05/2020 1PM  Type of Therapy/Topic:  Group Therapy:  Balance in Life  Participation Level:  Active  Description of Group:   This group will address the concept of balance and how it feels and looks when one is unbalanced. Patients will be encouraged to process areas in their lives that are out of balance and identify reasons for remaining unbalanced. Facilitators will guide patients in utilizing problem-solving interventions to address and correct the stressor making their life unbalanced. Understanding and applying boundaries will be explored and addressed for obtaining and maintaining a balanced life. Patients will be encouraged to explore ways to assertively make their unbalanced needs known to significant others in their lives, using other group members and facilitator for support and feedback.  Therapeutic Goals: 1. Patient will identify two or more emotions or situations they have that consume much of in their lives. 2. Patient will identify signs/triggers that life has become out of balance:  3. Patient will identify two ways to set boundaries in order to achieve balance in their lives:  4. Patient will demonstrate ability to communicate their needs through discussion and/or role plays  Summary of Patient Progress: Pt actively and appropriately participated in group session. Pt required some redirection when he veered off topic but overall demonstrated good insight. Pt interacted appropriately with group members and offered feedback to a group member to assist her with gaining understanding of her family dynamics and possible coping methods to manage stressor. Pt open to feedback from group members and respected boundaries during session.    Therapeutic Modalities:   Cognitive Behavioral Therapy Solution-Focused Therapy Assertiveness Training  Deontez Klinke Philip Aspen, LCSW

## 2020-01-05 NOTE — Progress Notes (Signed)
D- Patient alert and oriented. Affect/mood is calm , cooperative but has an anxious affect . Pt's speech is tangential, pressured and rapid.  Pt denies SI, HI, AVH, and pain. Pt says that he feels much better and has not had an anxiety attack today. Pt attended all groups.   A- Scheduled medications administered to patient, per MD orders. Support and encouragement provided.  Routine safety checks conducted every 15 minutes.  Patient informed to notify staff with problems or concerns.   R- No adverse drug reactions noted. Patient contracts for safety at this time. Patient compliant with medications and treatment plan. Patient receptive, calm, and cooperative. Patient interacts well with others on the unit.  Patient remains safe at this time.  Torrie Mayers Rn

## 2020-01-05 NOTE — Progress Notes (Signed)
Patient continues to be intrusive. Can be demanding of time, requesting designated time slot to talk about issues. Strugles to keep on topic when allowed to speak about his concerns. He denies SI/HI/AVH depression on this encounter. Does admit to having anxiety to which he could not / would not rate.  Received his prescribed meds and tolerated without incident. Had to be directed to leave the med room to allow others to get their meds. He continues to be safe on the unit with 15 minute safety checks.

## 2020-01-05 NOTE — Plan of Care (Signed)
Patient presents needy and intrusive with staff  Problem: Education: Goal: Emotional status will improve Outcome: Progressing Goal: Mental status will improve Outcome: Progressing

## 2020-01-06 LAB — URINALYSIS, COMPLETE (UACMP) WITH MICROSCOPIC
Bilirubin Urine: NEGATIVE
Glucose, UA: NEGATIVE mg/dL
Hgb urine dipstick: NEGATIVE
Ketones, ur: NEGATIVE mg/dL
Leukocytes,Ua: NEGATIVE
Nitrite: NEGATIVE
Protein, ur: NEGATIVE mg/dL
Specific Gravity, Urine: 1.008 (ref 1.005–1.030)
pH: 7 (ref 5.0–8.0)

## 2020-01-06 NOTE — Progress Notes (Signed)
[facility] MD Progress Note  01/06/2020 3:58 PM Jon Ramos  MRN:  [id number] Subjective: Follow-up for this patient with bipolar mania.  Patient seen chart reviewed.  Patient acknowledges feeling better today which is observed also by the staff.  He slept better last night.  He is less hyperverbal and less intrusive today.  Still a little bit agitated and anxious but does not give off the same area of hostility.  Still tends to repeat himself but not with his much animation and is more open to appropriate feedback.  Denies any specific side effects to medicine. Principal Problem: Bipolar 1 disorder with moderate mania (HCC) Diagnosis: Principal Problem:   Bipolar 1 disorder with moderate mania (HCC) Active Problems:   Syncope and collapse   Bipolar 1 disorder (HCC)   Seizures (HCC)   Alcohol abuse  Total Time spent with patient: 30 minutes  Past Psychiatric History: Patient has a history of bipolar disorder is been many years since his last hospital stay  Past Medical History:  Past Medical History:  Diagnosis Date  . Bipolar 1 disorder (HCC)   . Hypertension    History reviewed. No pertinent surgical history. Family History: History reviewed. No pertinent family history. Family Psychiatric  History: See previous Social History:  Social History   Substance and Sexual Activity  Alcohol Use No     Social History   Substance and Sexual Activity  Drug Use No    Social History   Socioeconomic History  . Marital status: Married    Spouse name: Not on file  . Number of children: Not on file  . Years of education: Not on file  . Highest education level: Not on file  Occupational History  . Not on file  Tobacco Use  . Smoking status: Never Smoker  . Smokeless tobacco: Never Used  Substance and Sexual Activity  . Alcohol use: No  . Drug use: No  . Sexual activity: Not on file  Other Topics Concern  . Not on file  Social History Narrative  . Not on file   Social Determinants  of Health   Financial Resource Strain:   . Difficulty of Paying Living Expenses:   Food Insecurity:   . Worried About Programme researcher, broadcasting/film/video in the Last Year:   . Barista in the Last Year:   Transportation Needs:   . Freight forwarder (Medical):   Marland Kitchen Lack of Transportation (Non-Medical):   Physical Activity:   . Days of Exercise per Week:   . Minutes of Exercise per Session:   Stress:   . Feeling of Stress :   Social Connections:   . Frequency of Communication with Friends and Family:   . Frequency of Social Gatherings with Friends and Family:   . Attends Religious Services:   . Active Member of Clubs or Organizations:   . Attends Banker Meetings:   Marland Kitchen Marital Status:    Additional Social History:                         Sleep: Fair  Appetite:  Fair  Current Medications: Current Facility-Administered Medications  Medication Dose Route Frequency Provider Last Rate Last Admin  . acetaminophen (TYLENOL) tablet 650 mg  650 mg Oral Q6H PRN Gillermo Murdoch, NP   650 mg at 01/06/20 0349  . alum & mag hydroxide-simeth (MAALOX/MYLANTA) 200-200-20 MG/5ML suspension 30 mL  30 mL Oral Q4H PRN Gillermo Murdoch, NP      .  diphenhydrAMINE (BENADRYL) injection 50 mg  50 mg Intramuscular Q6H PRN Roselind Messier, MD      . divalproex (DEPAKOTE ER) 24 hr tablet 1,250 mg  1,250 mg Oral QHS Marshay Slates T, MD   1,250 mg at 01/05/20 2145  . haloperidol (HALDOL) tablet 5 mg  5 mg Oral Q6H PRN Roselind Messier, MD   5 mg at 01/05/20 2145   Or  . haloperidol lactate (HALDOL) injection 5 mg  5 mg Intramuscular Q6H PRN Roselind Messier, MD      . LORazepam (ATIVAN) tablet 2 mg  2 mg Oral Q6H PRN Roselind Messier, MD      . magnesium hydroxide (MILK OF MAGNESIA) suspension 30 mL  30 mL Oral Daily PRN Gillermo Murdoch, NP      . QUEtiapine (SEROQUEL) tablet 300 mg  300 mg Oral QHS Carmen Vallecillo T, MD   300 mg at 01/05/20 2145    Lab Results:  Results  for orders placed or performed during the hospital encounter of 12/31/19 (from the past 48 hour(s))  Valproic acid level     Status: Abnormal   Collection Time: 01/05/20  7:06 AM  Result Value Ref Range   Valproic Acid Lvl 107 (H) 50.0 - 100.0 ug/mL    Comment: Performed at [facility], [address] [place], [place] [address]  Urinalysis, Complete w Microscopic     Status: Abnormal   Collection Time: 01/06/20  2:25 PM  Result Value Ref Range   Color, Urine STRAW (A) YELLOW   APPearance CLEAR (A) CLEAR   Specific Gravity, Urine 1.008 1.005 - 1.030   pH 7.0 5.0 - 8.0   Glucose, UA NEGATIVE NEGATIVE mg/dL   Hgb urine dipstick NEGATIVE NEGATIVE   Bilirubin Urine NEGATIVE NEGATIVE   Ketones, ur NEGATIVE NEGATIVE mg/dL   Protein, ur NEGATIVE NEGATIVE mg/dL   Nitrite NEGATIVE NEGATIVE   Leukocytes,Ua NEGATIVE NEGATIVE    Comment: Performed at [facility], [address] [place], [place] [address]    Blood Alcohol level:  Lab Results  Component Value Date   [facility] <10 12/24/2019    Metabolic Disorder Labs: No results found for: HGBA1C, MPG No results found for: PROLACTIN No results found for: CHOL, TRIG, HDL, CHOLHDL, VLDL, LDLCALC  Physical Findings: AIMS:  , ,  ,  ,    CIWA:    COWS:     Musculoskeletal: Strength & Muscle Tone: within normal limits Gait & Station: normal Patient leans: N/A  Psychiatric Specialty Exam: Physical Exam Vitals and nursing note reviewed.  Constitutional:      Appearance: He is well-developed.  HENT:     Head: Normocephalic and atraumatic.  Eyes:     Conjunctiva/sclera: Conjunctivae normal.     Pupils: Pupils are equal, round, and reactive to light.  Cardiovascular:     Heart sounds: Normal heart sounds.  Pulmonary:     Effort: Pulmonary effort is normal.  Abdominal:     Palpations: Abdomen is soft.  Musculoskeletal:        General: Normal range of motion.     Cervical back: Normal range of motion.  Skin:     General: Skin is warm and dry.  Neurological:     General: No focal deficit present.     Mental Status: He is alert.  Psychiatric:        Attention and Perception: Attention normal.        Mood and Affect: Mood is anxious.  Speech: Speech normal.        Behavior: Behavior is cooperative.        Thought Content: Thought content normal.        Cognition and Memory: Cognition normal.        Judgment: Judgment is impulsive.     Review of Systems  Constitutional: Negative.   HENT: Negative.   Eyes: Negative.   Respiratory: Negative.   Cardiovascular: Negative.   Gastrointestinal: Negative.   Musculoskeletal: Negative.   Skin: Negative.   Neurological: Negative.   Psychiatric/Behavioral: Negative for hallucinations, sleep disturbance and suicidal ideas.    Blood pressure (!) 136/95, pulse 92, temperature 98 F (36.7 C), temperature source Oral, resp. rate 18, height 5\' 9"  (1.753 m), weight 98.9 kg, SpO2 100 %.Body mass index is 32.19 kg/m.  General Appearance: Casual  Eye Contact:  Good  Speech:  Pressured  Volume:  Increased  Mood:  Euthymic  Affect:  Congruent  Thought Process:  Goal Directed  Orientation:  Full (Time, Place, and Person)  Thought Content:  Logical and Rumination  Suicidal Thoughts:  No  Homicidal Thoughts:  No  Memory:  Immediate;   Fair Recent;   Fair Remote;   Fair  Judgement:  Fair  Insight:  Fair  Psychomotor Activity:  Increased  Concentration:  Concentration: Fair  Recall:  of Knowledge:  Fair  Language:  Fair  Akathisia:  No  Handed:  Right  AIMS (if indicated):     Assets:  Desire for Improvement Housing Physical Health Resilience Social Support  ADL's:  Intact  Cognition:  WNL  Sleep:  Number of Hours: 8     Treatment Plan Summary: Daily contact with patient to assess and evaluate symptoms and progress in treatment, Medication management and Plan Patient seen and chart reviewed.  Case reviewed with treatment team.   Also with the patient's consent I spoke with his wife.  Wife last spoke with the patient yesterday when he was more manic and was concerned that he was not yet ready for discharge.  I agreed that as of yesterday that was certainly true.  Today he is looking better.  He accepted appropriate conversation about his medicine plan.  No change to medicine.  If things continue at this rate he is likely to be ready by tomorrow or the following day.  [employer], MD 01/06/2020, 3:58 PM
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
## 2020-01-06 NOTE — Plan of Care (Signed)
  Problem: Education: Goal: Emotional status will improve Outcome: Progressing Goal: Mental status will improve Outcome: Progressing   Problem: Safety: Goal: Periods of time without injury will increase Outcome: Progressing   Problem: Safety: Goal: Ability to redirect hostility and anger into socially appropriate behaviors will improve Outcome: Progressing   

## 2020-01-06 NOTE — Plan of Care (Signed)
Pt denies depression, anxiety, SI, HI and AVH. Pt was educated on care plan and verbalizes understanding. Pt was encouraged to attend groups. Torrie Mayers RN   Problem: Education: Goal: Knowledge of [facility] Education information/materials will improve Outcome: Progressing Goal: Emotional status will improve Outcome: Progressing Goal: Mental status will improve Outcome: Progressing Goal: Verbalization of understanding the information provided will improve Outcome: Progressing   Problem: Safety: Goal: Periods of time without injury will increase Outcome: Progressing   Problem: Activity: Goal: Will verbalize the importance of balancing activity with adequate rest periods Outcome: Progressing   Problem: Activity: Goal: Will verbalize the importance of balancing activity with adequate rest periods Outcome: Progressing   Problem: Safety: Goal: Ability to redirect hostility and anger into socially appropriate behaviors will improve Outcome: Progressing Goal: Ability to remain free from injury will improve Outcome: Progressing

## 2020-01-06 NOTE — Progress Notes (Signed)
Recreation Therapy Notes   Date: 01/06/2020  Time: 9:30 am  Location: Craft room    Behavioral response: Appropriate  Intervention Topic: Problem Solving   Discussion/Intervention:  Group content on today was focused on problem solving. The group described what problem solving is. Patients expressed how problems affect them and how they deal with problems. Individuals identified healthy ways to deal with problems. Patients explained what normally happens to them when they do not deal with problems. The group expressed reoccurring problems for them. The group participated in the intervention "Ways to Solve problems" where patients were given a chance to explore different ways to solve problems.  Clinical Observations/Feedback:  Patient came to group and defined problem solving as conflict resolution. He stated that he likes to solve his problems by talking things through, journaling, making a list, apologizing and using resources and research. Participant stated that unsolved problems can affect your sleep and cause worry. Individual was actively social with peers and staff while participating in the intervention.  Marwan Lipe LRT/CTRS         Lansing Sigmon 01/06/2020 1:22 PM
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
## 2020-01-06 NOTE — Progress Notes (Signed)
Recreation Therapy Notes    Date: 01/06/2020  Time: 10:30 am  Location: Outside Courtyard   Behavioral response: Appropriate  Group Type: Leisure  Participation level: Active  Communication: Patient was social with peers and staff.  Comments: N/A  [name] LRT/CTRS        Jon Ramos 01/06/2020 1:28 PM 

## 2020-01-07 NOTE — Progress Notes (Signed)
[facility] MD Progress Note  01/07/2020 4:41 PM Jon Ramos  MRN:  [id number] Subjective: Patient seen chart reviewed.  Patient appears to be significantly improved.  He has been calm throughout the day.  Not intrusive at all.  Came to speak with me this afternoon only when I sought him out.  Speech was normal in rate and tone and not pressured at all.  Thoughts lucid.  Appropriate and on topic.  No complaints of side effects. Principal Problem: Bipolar 1 disorder with moderate mania (HCC) Diagnosis: Principal Problem:   Bipolar 1 disorder with moderate mania (HCC) Active Problems:   Syncope and collapse   Bipolar 1 disorder (HCC)   Seizures (HCC)   Alcohol abuse  Total Time spent with patient: 30 minutes  Past Psychiatric History: Past history of several episodes of bipolar psychosis in the past  Past Medical History:  Past Medical History:  Diagnosis Date  . Bipolar 1 disorder (HCC)   . Hypertension    History reviewed. No pertinent surgical history. Family History: History reviewed. No pertinent family history. Family Psychiatric  History: Grandfather killed himself but details unknown Social History:  Social History   Substance and Sexual Activity  Alcohol Use No     Social History   Substance and Sexual Activity  Drug Use No    Social History   Socioeconomic History  . Marital status: Married    Spouse name: Not on file  . Number of children: Not on file  . Years of education: Not on file  . Highest education level: Not on file  Occupational History  . Not on file  Tobacco Use  . Smoking status: Never Smoker  . Smokeless tobacco: Never Used  Substance and Sexual Activity  . Alcohol use: No  . Drug use: No  . Sexual activity: Not on file  Other Topics Concern  . Not on file  Social History Narrative  . Not on file   Social Determinants of Health   Financial Resource Strain:   . Difficulty of Paying Living Expenses:   Food Insecurity:   . Worried About  Programme researcher, broadcasting/film/video in the Last Year:   . Barista in the Last Year:   Transportation Needs:   . Freight forwarder (Medical):   Marland Kitchen Lack of Transportation (Non-Medical):   Physical Activity:   . Days of Exercise per Week:   . Minutes of Exercise per Session:   Stress:   . Feeling of Stress :   Social Connections:   . Frequency of Communication with Friends and Family:   . Frequency of Social Gatherings with Friends and Family:   . Attends Religious Services:   . Active Member of Clubs or Organizations:   . Attends Banker Meetings:   Marland Kitchen Marital Status:    Additional Social History:                         Sleep: Fair  Appetite:  Fair  Current Medications: Current Facility-Administered Medications  Medication Dose Route Frequency Provider Last Rate Last Admin  . acetaminophen (TYLENOL) tablet 650 mg  650 mg Oral Q6H PRN Gillermo Murdoch, NP   650 mg at 01/07/20 1219  . alum & mag hydroxide-simeth (MAALOX/MYLANTA) 200-200-20 MG/5ML suspension 30 mL  30 mL Oral Q4H PRN Gillermo Murdoch, NP      . diphenhydrAMINE (BENADRYL) injection 50 mg  50 mg Intramuscular Q6H PRN Roselind Messier, MD      .  divalproex (DEPAKOTE ER) 24 hr tablet 1,250 mg  1,250 mg Oral QHS Lindel Marcell T, MD   1,250 mg at 01/06/20 2134  . haloperidol (HALDOL) tablet 5 mg  5 mg Oral Q6H PRN Roselind Messier, MD   5 mg at 01/05/20 2145   Or  . haloperidol lactate (HALDOL) injection 5 mg  5 mg Intramuscular Q6H PRN Roselind Messier, MD      . LORazepam (ATIVAN) tablet 2 mg  2 mg Oral Q6H PRN Roselind Messier, MD      . magnesium hydroxide (MILK OF MAGNESIA) suspension 30 mL  30 mL Oral Daily PRN Gillermo Murdoch, NP      . QUEtiapine (SEROQUEL) tablet 300 mg  300 mg Oral QHS Emsley Custer, Jackquline Denmark, MD   300 mg at 01/06/20 2133    Lab Results:  Results for orders placed or performed during the hospital encounter of 12/31/19 (from the past 48 hour(s))  Urinalysis, Complete w  Microscopic     Status: Abnormal   Collection Time: 01/06/20  2:25 PM  Result Value Ref Range   Color, Urine STRAW (A) YELLOW   APPearance CLEAR (A) CLEAR   Specific Gravity, Urine 1.008 1.005 - 1.030   pH 7.0 5.0 - 8.0   Glucose, UA NEGATIVE NEGATIVE mg/dL   Hgb urine dipstick NEGATIVE NEGATIVE   Bilirubin Urine NEGATIVE NEGATIVE   Ketones, ur NEGATIVE NEGATIVE mg/dL   Protein, ur NEGATIVE NEGATIVE mg/dL   Nitrite NEGATIVE NEGATIVE   Leukocytes,Ua NEGATIVE NEGATIVE    Comment: Performed at [facility], [address] [place], [place] [address]    Blood Alcohol level:  Lab Results  Component Value Date   [facility] <10 12/24/2019    Metabolic Disorder Labs: No results found for: HGBA1C, MPG No results found for: PROLACTIN No results found for: CHOL, TRIG, HDL, CHOLHDL, VLDL, LDLCALC  Physical Findings: AIMS:  , ,  ,  ,    CIWA:    COWS:     Musculoskeletal: Strength & Muscle Tone: within normal limits Gait & Station: normal Patient leans: N/A  Psychiatric Specialty Exam: Physical Exam Vitals and nursing note reviewed.  Constitutional:      Appearance: He is well-developed.  HENT:     Head: Normocephalic and atraumatic.  Eyes:     Conjunctiva/sclera: Conjunctivae normal.     Pupils: Pupils are equal, round, and reactive to light.  Cardiovascular:     Heart sounds: Normal heart sounds.  Pulmonary:     Effort: Pulmonary effort is normal.  Abdominal:     Palpations: Abdomen is soft.  Musculoskeletal:        General: Normal range of motion.     Cervical back: Normal range of motion.  Skin:    General: Skin is warm and dry.  Neurological:     General: No focal deficit present.     Mental Status: He is alert.  Psychiatric:        Attention and Perception: Attention normal.        Mood and Affect: Mood normal.        Speech: Speech normal.        Behavior: Behavior normal.        Thought Content: Thought content normal.        Cognition and Memory:  Cognition normal.        Judgment: Judgment normal.     Review of Systems  Constitutional: Negative.   HENT: Negative.   Eyes: Negative.   Respiratory:  Negative.   Cardiovascular: Negative.   Gastrointestinal: Negative.   Musculoskeletal: Negative.   Skin: Negative.   Neurological: Negative.   Psychiatric/Behavioral: Negative.     Blood pressure (!) 125/99, pulse 92, temperature 97.7 F (36.5 C), temperature source Oral, resp. rate 17, height 5\' 9"  (1.753 m), weight 98.9 kg, SpO2 100 %.Body mass index is 32.19 kg/m.  General Appearance: Casual  Eye Contact:  Good  Speech:  Clear and Coherent  Volume:  Normal  Mood:  Euthymic  Affect:  Congruent  Thought Process:  Goal Directed  Orientation:  Full (Time, Place, and Person)  Thought Content:  Logical  Suicidal Thoughts:  No  Homicidal Thoughts:  No  Memory:  Immediate;   Fair Recent;   Fair Remote;   Fair  Judgement:  Fair  Insight:  Fair  Psychomotor Activity:  Normal  Concentration:  Concentration: Fair  Recall:  of Knowledge:  Fair  Language:  Fair  Akathisia:  No  Handed:  Right  AIMS (if indicated):     Assets:  Desire for Improvement Financial Resources/Insurance Housing Physical Health Resilience Social Support  ADL's:  Intact  Cognition:  WNL  Sleep:  Number of Hours: 7.75     Treatment Plan Summary: Daily contact with patient to assess and evaluate symptoms and progress in treatment, Medication management and Plan Much improved.  Tolerating medicine well.  Good insight.  No sign of acute dangerousness.  I think he is ready for discharge and we will aim for tomorrow.  I will try to reach his wife this evening to check in with her.  Answered questions about medication.  We will try to make sure we have a good outpatient referral at discharge  [employer], MD 01/07/2020, 4:41 PM

## 2020-01-07 NOTE — Progress Notes (Signed)
BRIEF PHARMACY NOTE ° ° °This patient attended and participated in Medication Management Group counseling led by [facility] staff pharmacist. ° °This interactive class reviews basic information about prescription medications and education on personal responsibility in medication management.  The class also includes general knowledge of 3 main classes of behavioral medications, including antipsychotics, antidepressants, and mood stabilizers.    ° °Patient behavior was appropriate for group setting. ° ° °Educational materials sourced from: ° °"Medication Do's and Don'ts" from WWW.MED-PASS.COM  ° °"Mental Health Medications" from National Institute of Mental Health °Https://www.nimh.nih.gov/health/topics/mental-health-medications/index.shtml#part 149856  ° ° ° °Aprel Egelhoff M Salif Tay, PharmD, BCPS °Clinical Pharmacist °01/07/2020 3:30 PM ° °

## 2020-01-07 NOTE — Progress Notes (Signed)
Patient pleasant during assessment denying SI/HI/AVH. Patient stated he was feeling better today and didn't have an anxiety attack to day. Patient compliant with medication administration per MD orders. Patient less intrusive with staff this evening. Patient given education, support and encouragement to be active in his treatment plan. Patient being monitored Q 15 minutes for safety per unit protocol. Pt remains safe on the unit.

## 2020-01-07 NOTE — Evaluation (Signed)
Physical Therapy Evaluation Patient Details Name: Jon Ramos MRN: [id number] DOB: [birth year] Today's Date: 01/07/2020   History of Present Illness  Pt admitted for bipolar mania with history of the same. PT consulted this date due to R knee pain. History of seizures with LOC.  Clinical Impression  Pt is a very pleasant 54 year old male who was admitted for bipolar mania. Pt very excited to work with therapy. Pt performs bed mobility, transfers, and ambulation with independence without AD. Pt demonstrates deficits with R knee pain especially at joint line. + varus stress test. Edema around knee extending distal in ant foot. Educated on pain relief and edema relieving strategies. Would benefit from skilled PT to address above deficits and promote optimal return to PLOF. Currently recommending OP PT for follow up. If edema doesn't decrease in the next few days with recommended treatment, may be beneficial for further ortho-MD follow up.    Follow Up Recommendations Outpatient PT    Equipment Recommendations  None recommended by PT    Recommendations for Other Services       Precautions / Restrictions Restrictions Weight Bearing Restrictions: No      Mobility  Bed Mobility Overal bed mobility: Independent             General bed mobility comments: safe technique  Transfers Overall transfer level: Independent Equipment used: None             General transfer comment: safe technique  Ambulation/Gait Ambulation/Gait assistance: Independent Gait Distance (Feet): 200 Feet Assistive device: None Gait Pattern/deviations: Step-through pattern;Antalgic     General Gait Details: ambulated using reciprocal gait pattern but demonstrates antalgic due to R knee pain. No giveaway weakness or LOB noted  Stairs            Wheelchair Mobility    Modified Rankin (Stroke Patients Only)       Balance Overall balance assessment: Independent                                            Pertinent Vitals/Pain Pain Assessment: Faces Faces Pain Scale: Hurts little more Pain Location: R ant/lateral knee Pain Descriptors / Indicators: Aching;Dull;Guarding Pain Intervention(s): Limited activity within patient's tolerance    Home Living Family/patient expects to be discharged to:: Private residence Living Arrangements: Spouse/significant other;Other relatives Available Help at Discharge: Family Type of Home: House       Home Layout: Two level Home Equipment: None Additional Comments: reports his bedroom is upstairs and he usually has no trouble ambulating the stairs. DIscussed sequencing due to pain    Prior Function Level of Independence: Independent         Comments: reports falls due to seizures but due to LOC, doesn't recall complete knowledge of injuries     Hand Dominance        Extremity/Trunk Assessment   Upper Extremity Assessment Upper Extremity Assessment: Overall WFL for tasks assessed    Lower Extremity Assessment Lower Extremity Assessment: Generalized weakness (R LE painful; grossly 4/5)       Communication   Communication: No difficulties  Cognition Arousal/Alertness: Awake/alert Behavior During Therapy: WFL for tasks assessed/performed Overall Cognitive Status: Within Functional Limits for tasks assessed  General Comments: slightly verbose, however able to keep on topic.      General Comments      Exercises Other Exercises Other Exercises: Discussed edema prevention including RICE treatment. Observed and instructed in inversion with pt supine on floor (pillow under head) and elevated B LEs. Pt able to independently get on/off floor. Educated on ther-ex including AP, ankle circles, knee flex/ext, and SLRs. 10 reps with mod I.    Assessment/Plan    PT Assessment Patient needs continued PT services  PT Problem List Decreased strength;Decreased  mobility;Pain       PT Treatment Interventions Gait training;Therapeutic exercise;Modalities    PT Goals (Current goals can be found in the Care Plan section)  Acute Rehab PT Goals Patient Stated Goal: to go home PT Goal Formulation: With patient Time For Goal Achievement: 01/21/20 Potential to Achieve Goals: Good    Frequency Min 2X/week   Barriers to discharge        Co-evaluation               AM-PAC PT "6 Clicks" Mobility  Outcome Measure Help needed turning from your back to your side while in a flat bed without using bedrails?: None Help needed moving from lying on your back to sitting on the side of a flat bed without using bedrails?: None Help needed moving to and from a bed to a chair (including a wheelchair)?: None Help needed standing up from a chair using your arms (e.g., wheelchair or bedside chair)?: None Help needed to walk in hospital room?: None Help needed climbing 3-5 steps with a railing? : None 6 Click Score: 24    End of Session   Activity Tolerance: Patient tolerated treatment well Patient left:  (on floor in inversion stretch (pt safe)) Nurse Communication: Mobility status PT Visit Diagnosis: Muscle weakness (generalized) (M62.81);Difficulty in walking, not elsewhere classified (R26.2);Pain Pain - Right/Left: Right Pain - part of body: Knee    Time: 0093-8182 PT Time Calculation (min) (ACUTE ONLY): 27 min   Charges:   PT Evaluation $PT Eval Low Complexity: 1 Low PT Treatments $Therapeutic Exercise: 8-22 mins        Elizabeth Palau, PT, DPT [phone]   Loni Abdon 01/07/2020, 3:17 PM

## 2020-01-07 NOTE — Plan of Care (Signed)
Patient rated his depression and anxiety 0/10.Patient stated that talking to his family is the very triggering reason for his anxiety but he talked to his wife and parents today that did not bring any anxiety.Patient is appropriate in the unit.PT recommended for out patient therapy.Attended groups.Appetite and energy level good.Support and encouragement given.

## 2020-01-07 NOTE — B[facility] Counselor (Signed)
LCSW Group Therapy Note  01/07/2020 2:43 PM  Type of Therapy/Topic:  Group Therapy:  Balance in Life  Participation Level:  Active  Description of Group:    This group will address the concept of balance and how it feels and looks when one is unbalanced. Patients will be encouraged to process areas in their lives that are out of balance and identify reasons for remaining unbalanced. Facilitators will guide patients in utilizing problem-solving interventions to address and correct the stressor making their life unbalanced. Understanding and applying boundaries will be explored and addressed for obtaining and maintaining a balanced life. Patients will be encouraged to explore ways to assertively make their unbalanced needs known to significant others in their lives, using other group members and facilitator for support and feedback.  Therapeutic Goals: 1. Patient will identify two or more emotions or situations they have that consume much of in their lives. 2. Patient will identify signs/triggers that life has become out of balance:  3. Patient will identify two ways to set boundaries in order to achieve balance in their lives:  4. Patient will demonstrate ability to communicate their needs through discussion and/or role plays  Summary of Patient Progress: Patient was present in group.  Patient was attentive throughout, however CSW had to redirect patient several times from trying to take over group.  Patient was observed reading ahead and trying to teach coping skills to other patients sitting near him.  CSW had to redirect pt again.    Therapeutic Modalities:   Cognitive Behavioral Therapy Solution-Focused Therapy Assertiveness Training  Penni Homans MSW, LCSW 01/07/2020 2:43 PM

## 2020-01-07 NOTE — Plan of Care (Signed)
Patient presents less manic than when this writer has had the patient in the past  Problem: Education: Goal: Emotional status will improve Outcome: Progressing Goal: Mental status will improve Outcome: Progressing

## 2020-01-07 NOTE — Progress Notes (Signed)
Recreation Therapy Notes   Date: 01/07/2020  Time: 9:30 am  Location: Craft room    Behavioral response: Appropriate  Intervention Topic: Coping Skills    Discussion/Intervention:  Group content on today was focused on coping skills. The group defined what coping skills are and when they can be used. Individuals described how they normally cope with thing and the coping skills they normally use. Patients expressed why it is important to cope with things and how not coping with things can affect you. The group participated in the intervention "My coping box" and made coping boxes while adding coping skills they could use in the future to the box. Clinical Observations/Feedback:  Patient came to group and defined coping skills as having something to go to calm down and deal with issues. He explained that he picks coping skills based off what is easiest at the time. Participant expressed that participating in bad coping skills can lead to detrimental effects. Individual was actively social with peers and staff while participating in the intervention.  Jon Ramos LRT/CTRS        Tracen Mahler 01/07/2020 12:11 PM

## 2020-01-08 MED ORDER — DIVALPROEX SODIUM ER 250 MG PO TB24: 1250.0000 mg | ORAL_TABLET | Freq: Every day | ORAL | 0 refills | Status: AC

## 2020-01-08 MED ORDER — QUETIAPINE FUMARATE 300 MG PO TABS: 300.0000 mg | ORAL_TABLET | Freq: Every day | ORAL | 0 refills | Status: DC

## 2020-01-08 NOTE — Progress Notes (Signed)
°  [facility] Adult Case Management Discharge Plan :  Will you be returning to the same living situation after discharge:  Yes,  pt is returning home. At discharge, do you have transportation home?: Yes,  CSW will assist pt with transportation. Do you have the ability to pay for your medications: Yes,  BCBS  Release of information consent forms completed and in the chart;  Patient's signature needed at discharge.  Patient to Follow up at:  Follow-up Information    [facility]. Go on 01/11/2020.   Why: Your appointment is scheduled for 01/11/2020 at 3pm. Thank you. Contact information: [address],  [place], [place] [address] Ph: [phone]  Fax:[phone]              Next level of care provider has access to [facility] Link:no  Safety Planning and Suicide Prevention discussed: Yes,  SPE completed with the patient's wife.     Has patient been referred to the Quitline?: Patient refused referral  Patient has been referred for addiction treatment: Pt. refused referral  Harden Mo, LCSW 01/08/2020, 11:46 AM

## 2020-01-08 NOTE — Progress Notes (Signed)
   01/08/20 1407  Clinical Encounter Type  Visited With Patient  Visit Type Follow-up;Spiritual support;Social support;Behavioral Health  Referral From Chaplain  Consult/Referral To Chaplain  Ch visited with Pt as a follow-up. Pt was getting ready to go home. Pt was happy and nervous about going home. Pt had a good plan and was hoping he could work his plan when he gets home. Pt asked for prayer. Ch prayed for PT.

## 2020-01-08 NOTE — Discharge Summary (Signed)
Physician Discharge Summary Note  Patient:  Jon Ramos is an 54 y.o., male MRN:  [id number] DOB:  [birth year] Patient phone:  [phone] (home)  Patient address:   [address] [facility] [place] [address],  Total Time spent with patient: 30 minutes  Date of Admission:  12/31/2019 Date of Discharge: 01/08/2020  Reason for Admission: Patient admitted with manic agitated symptoms after an episode of seizure and behavior change.  Principal Problem: Bipolar 1 disorder with moderate mania (HCC) Discharge Diagnoses: Principal Problem:   Bipolar 1 disorder with moderate mania (HCC) Active Problems:   Syncope and collapse   Bipolar 1 disorder (HCC)   Seizures (HCC)   Alcohol abuse   Past Psychiatric History: Past history of bipolar disorder with previous psychotic episodes although it had been years since his last active episode.  Past Medical History:  Past Medical History:  Diagnosis Date   Bipolar 1 disorder (HCC)    Hypertension    History reviewed. No pertinent surgical history. Family History: History reviewed. No pertinent family history. Family Psychiatric  History: Positive for bipolar Social History:  Social History   Substance and Sexual Activity  Alcohol Use No     Social History   Substance and Sexual Activity  Drug Use No    Social History   Socioeconomic History   Marital status: Married    Spouse name: Not on file   Number of children: Not on file   Years of education: Not on file   Highest education level: Not on file  Occupational History   Not on file  Tobacco Use   Smoking status: Never Smoker   Smokeless tobacco: Never Used  Substance and Sexual Activity   Alcohol use: No   Drug use: No   Sexual activity: Not on file  Other Topics Concern   Not on file  Social History Narrative   Not on file   Social Determinants of Health   Financial Resource Strain:    Difficulty of Paying Living Expenses:   Food Insecurity:     Worried About Programme researcher, broadcasting/film/video in the Last Year:    Barista in the Last Year:   Transportation Needs:    Freight forwarder (Medical):    Lack of Transportation (Non-Medical):   Physical Activity:    Days of Exercise per Week:    Minutes of Exercise per Session:   Stress:    Feeling of Stress :   Social Connections:    Frequency of Communication with Friends and Family:    Frequency of Social Gatherings with Friends and Family:    Attends Religious Services:    Active Member of Clubs or Organizations:    Attends Banker Meetings:    Marital Status:     Hospital Course: Admitted to psychiatric ward.  Maintained on 15-minute checks.  Patient was not violent or threatening during his hospital stay.  He was intrusive and hyperactive and hyperverbal and hyper religious initially with a whole array of symptoms consistent and typical with bipolar mania.  He was treated with Depakote.  Level was checked at a dose of 1500 and was over 100, so I have cutting back to 1250.  He is not reporting any side effects from that.  Additionally he is being treated with quetiapine which has now been increased to 300 mg at night.  He is also tolerating that well.  He has been taken off of antidepressant medicine.  Patient has regained  lucidity and is able to sit still and hold a reasonable conversation with normal affect.  I have been in touch with his wife on more than one occasion and she understands as well the importance of continuing medicine.  I have recommended to the patient that he take at least a week off from work and will be providing a note to that effect.  He has arrangements to see an outpatient psychiatrist on Monday.  Physical Findings: AIMS:  , ,  ,  ,    CIWA:    COWS:     Musculoskeletal: Strength & Muscle Tone: within normal limits Gait & Station: normal Patient leans: N/A  Psychiatric Specialty Exam: Physical Exam Vitals and nursing note  reviewed.  Constitutional:      Appearance: He is well-developed.  HENT:     Head: Normocephalic and atraumatic.  Eyes:     Conjunctiva/sclera: Conjunctivae normal.     Pupils: Pupils are equal, round, and reactive to light.  Cardiovascular:     Heart sounds: Normal heart sounds.  Pulmonary:     Effort: Pulmonary effort is normal.  Abdominal:     Palpations: Abdomen is soft.  Musculoskeletal:        General: Normal range of motion.     Cervical back: Normal range of motion.  Skin:    General: Skin is warm and dry.  Neurological:     General: No focal deficit present.     Mental Status: He is alert.  Psychiatric:        Mood and Affect: Mood normal.        Behavior: Behavior normal.        Thought Content: Thought content normal.        Judgment: Judgment normal.     Review of Systems  Constitutional: Negative.   HENT: Negative.   Eyes: Negative.   Respiratory: Negative.   Cardiovascular: Negative.   Gastrointestinal: Negative.   Musculoskeletal: Negative.   Skin: Negative.   Neurological: Negative.   Psychiatric/Behavioral: Negative.     Blood pressure (!) 125/99, pulse 92, temperature 97.7 F (36.5 C), temperature source Oral, resp. rate 17, height 5\' 9"  (1.753 m), weight 98.9 kg, SpO2 100 %.Body mass index is 32.19 kg/m.  General Appearance: Casual  Eye Contact:  Good  Speech:  Clear and Coherent  Volume:  Normal  Mood:  Euthymic  Affect:  Congruent  Thought Process:  Goal Directed  Orientation:  Full (Time, Place, and Person)  Thought Content:  Logical  Suicidal Thoughts:  No  Homicidal Thoughts:  No  Memory:  Immediate;   Fair Recent;   Fair Remote;   Fair  Judgement:  Fair  Insight:  Fair  Psychomotor Activity:  Normal  Concentration:  Concentration: Fair  Recall:  of Knowledge:  Fair  Language:  Fair  Akathisia:  No  Handed:  Right  AIMS (if indicated):     Assets:  Communication Skills Desire for Improvement Financial  Resources/Insurance Housing Physical Health Resilience Social Support  ADL's:  Intact  Cognition:  WNL  Sleep:  Number of Hours: 6.75        Has this patient used any form of tobacco in the last 30 days? (Cigarettes, Smokeless Tobacco, Cigars, and/or Pipes) Yes, No  Blood Alcohol level:  Lab Results  Component Value Date   ETH <10 12/24/2019    Metabolic Disorder Labs:  No results found for: HGBA1C, MPG No results found for: PROLACTIN No results found  for: CHOL, TRIG, HDL, CHOLHDL, VLDL, LDLCALC  See Psychiatric Specialty Exam and Suicide Risk Assessment completed by Attending Physician prior to discharge.  Discharge destination:  Home  Is patient on multiple antipsychotic therapies at discharge:  No   Has Patient had three or more failed trials of antipsychotic monotherapy by history:  No  Recommended Plan for Multiple Antipsychotic Therapies: NA  Discharge Instructions    Diet - low sodium heart healthy   Complete by: As directed    Increase activity slowly   Complete by: As directed      Allergies as of 01/08/2020      Reactions   Penicillins Hives   Sulfa Antibiotics Hives      Medication List    STOP taking these medications   amLODipine 10 MG tablet Commonly known as: NORVASC   escitalopram 10 MG tablet Commonly known as: LEXAPRO     TAKE these medications     Indication  divalproex 250 MG 24 hr tablet Commonly known as: DEPAKOTE ER Take 5 tablets (1,250 mg total) by mouth at bedtime. What changed:   medication strength  how much to take  when to take this  Indication: Manic Phase of Manic-Depression   QUEtiapine 300 MG tablet Commonly known as: SEROQUEL Take 1 tablet (300 mg total) by mouth at bedtime.  Indication: Manic Phase of Manic-Depression       Follow-up Information    [facility]. Go on 01/11/2020.   Why: Your appointment is scheduled for 01/11/2020 at 3pm. Thank you. Contact information: [address],   [place], [place] [address] Ph: [phone]  Fax:[phone]              Follow-up recommendations:  Activity:  Activity as tolerated Diet:  Regular diet Other:  Follow-up with outpatient psychiatry follow-up  Comments: 90-day prescriptions given at discharge  Signed: Mordecai Rasmussen, MD 01/08/2020, 11:30 AM

## 2020-01-08 NOTE — [facility] Group Notes (Signed)
[facility] Group Notes:  (Nursing/MHT/Case Management/Adjunct)  Date:  01/08/2020  Time:  1:55 PM  Type of Therapy:  Community Meeting  Participation Level:  Active  Participation Quality:  Intrusive  Affect:  Blunted  Cognitive:  Appropriate  Insight:  Improving  Engagement in Group:  Monopolizing  Modes of Intervention:  Discussion  Summary of Progress/Problems:  Clint Guy 01/08/2020, 1:55 PM

## 2020-01-08 NOTE — Progress Notes (Signed)
Recreation Therapy Notes  INPATIENT RECREATION TR PLAN  Patient Details Name: Jon Ramos MRN: [id number] DOB: [birth year] Today's Date: 01/08/2020  Rec Therapy Plan Is patient appropriate for Therapeutic Recreation?: Yes Treatment times per week: at least 3 Estimated Length of Stay: 5-7 days TR Treatment/Interventions: Group participation (Comment)  Discharge Criteria Pt will be discharged from therapy if:: Discharged Treatment plan/goals/alternatives discussed and agreed upon by:: Patient/family  Discharge Summary Short term goals set: Patient will focus on task/topic with 2 prompts from staff within 5 recreation therapy group sessions Short term goals met: Complete Progress toward goals comments: Groups attended Which groups?: Social skills, Coping skills, Other (Comment) (Emotions, Problem Solving, Happiness, Necessities) Reason goals not met: N/A Therapeutic equipment acquired: N/A Reason patient discharged from therapy: Discharge from hospital Pt/family agrees with progress & goals achieved: Yes Date patient discharged from therapy: 01/08/20   Jp Eastham 01/08/2020, 2:29 PM

## 2020-01-08 NOTE — B[facility] Group Notes (Signed)
Emotional Regulation 01/08/2020 1PM  Type of Therapy/Topic:  Group Therapy:  Emotion Regulation  Participation Level:  Did Not Attend   Description of Group:   The purpose of this group is to assist patients in learning to regulate negative emotions and experience positive emotions. Patients will be guided to discuss ways in which they have been vulnerable to their negative emotions. These vulnerabilities will be juxtaposed with experiences of positive emotions or situations, and patients will be challenged to use positive emotions to combat negative ones. Special emphasis will be placed on coping with negative emotions in conflict situations, and patients will process healthy conflict resolution skills.  Therapeutic Goals: 1. Patient will identify two positive emotions or experiences to reflect on in order to balance out negative emotions 2. Patient will label two or more emotions that they find the most difficult to experience 3. Patient will demonstrate positive conflict resolution skills through discussion and/or role plays  Summary of Patient Progress:       Therapeutic Modalities:   Cognitive Behavioral Therapy Feelings Identification Dialectical Behavioral Therapy   Suzan Slick, LCSW 01/08/2020 2:26 PM

## 2020-01-08 NOTE — Progress Notes (Signed)
Recreation Therapy Notes  Date: 01/08/2020  Time: 9:30 am  Location: Courtyard   Behavioral response: Appropriate  Intervention Topic: Emotions     Discussion/Intervention:  Group content on today was focused on emotions. The group identified what emotions are and why it is important to have emotions. Patients expressed some positive and negative emotions. Individuals gave some past experiences on how they normally dealt with emotions in the past. The group described some positive ways to deal with emotions in the future. Patients participated in the intervention "Name the Megan Salon" where individuals were given a chance to experience different emotions.  Clinical Observations/Feedback:  Patient came to group and was actively social with peers and staff while participating in the group intervention. Individual left group early and did not return.   Gerrod Maule LRT/CTRS         Sarahjane Matherly 01/08/2020 11:19 AM

## 2020-01-08 NOTE — Progress Notes (Signed)
Discharge Note:   Pt discharged at 14:30 via Safe transport to home. Upon discharge pt is alert and oriented to person, place, time and situation. Pt is calm, cooperative with discharge process. Pt was given discharge instructions, which included his follow up appointments, discharge medication education and prescriptions; pt verbalized understanding of all. All personal belongings returned to pt upon discharge. No distress noted, none reported, pt voiced no complaints. Upon discharge pt denies suicidal and homicidal ideation, denies feelings of depression and anxiety, denies hallucinations.

## 2020-01-08 NOTE — Plan of Care (Signed)
  Problem: Group Participation Goal: STG - Patient will focus on task/topic with 2 prompts from staff within 5 recreation therapy group sessions Description: STG - Patient will focus on task/topic with 2 prompts from staff within 5 recreation therapy group sessions Outcome: Completed/Met

## 2020-01-08 NOTE — [facility] Suicide Risk Assessment (Signed)
[facility] Discharge Suicide Risk Assessment   Principal Problem: Bipolar 1 disorder with moderate mania (HCC) Discharge Diagnoses: Principal Problem:   Bipolar 1 disorder with moderate mania (HCC) Active Problems:   Syncope and collapse   Bipolar 1 disorder (HCC)   Seizures (HCC)   Alcohol abuse   Total Time spent with patient: 30 minutes  Musculoskeletal: Strength & Muscle Tone: within normal limits Gait & Station: normal Patient leans: N/A  Psychiatric Specialty Exam: Review of Systems  Constitutional: Negative.   HENT: Negative.   Eyes: Negative.   Respiratory: Negative.   Cardiovascular: Negative.   Gastrointestinal: Negative.   Musculoskeletal: Negative.   Skin: Negative.   Neurological: Negative.   Psychiatric/Behavioral: Negative.     Blood pressure (!) 125/99, pulse 92, temperature 97.7 F (36.5 C), temperature source Oral, resp. rate 17, height 5\' 9"  (1.753 m), weight 98.9 kg, SpO2 100 %.Body mass index is 32.19 kg/m.  General Appearance: Casual  Eye Contact::  Good  Speech:  Clear and Coherent409  Volume:  Normal  Mood:  Euthymic  Affect:  Congruent  Thought Process:  Coherent  Orientation:  Full (Time, Place, and Person)  Thought Content:  Logical  Suicidal Thoughts:  No  Homicidal Thoughts:  No  Memory:  Immediate;   Fair Recent;   Fair Remote;   Fair  Judgement:  Fair  Insight:  Fair  Psychomotor Activity:  Normal  Concentration:  Fair  Recall:  [phone] of Knowledge:Fair  Language: Fair  Akathisia:  No  Handed:  Right  AIMS (if indicated):     Assets:  Desire for Improvement Housing Physical Health Resilience Social Support Talents/Skills  Sleep:  Number of Hours: 6.75  Cognition: WNL  ADL's:  Intact   Mental Status Per Nursing Assessment::   On Admission:  NA  Demographic Factors:  Male and Caucasian  Loss Factors: NA  Historical Factors: Impulsivity  Risk Reduction Factors:   Sense of responsibility to family, Employed,  Living with another person, especially a relative, Positive social support, Positive therapeutic relationship and Positive coping skills or problem solving skills  Continued Clinical Symptoms:  Bipolar Disorder:   Mixed State  Cognitive Features That Contribute To Risk:  None    Suicide Risk:  Minimal: No identifiable suicidal ideation.  Patients presenting with no risk factors but with morbid ruminations; may be classified as minimal risk based on the severity of the depressive symptoms   Follow-up Information    [facility]. Go on 01/11/2020.   Why: Your appointment is scheduled for 01/11/2020 at 3pm. Thank you. Contact information: [address],  [place], [place] Ph: [phone]  [phone]              Plan Of Care/Follow-up recommendations:  Activity:  Activity as tolerated Diet:  Regular diet Other:  Follow-up with outpatient mental health treatment continuing on current medication  [phone], MD 01/08/2020, 11:27 AM

## 2020-03-30 ENCOUNTER — Other Ambulatory Visit: Payer: Self-pay | Admitting: Psychiatry

## 2020-05-22 ENCOUNTER — Other Ambulatory Visit: Payer: Self-pay | Admitting: Psychiatry

## 2020-06-21 ENCOUNTER — Other Ambulatory Visit: Payer: Self-pay | Admitting: Psychiatry

## 2020-10-11 IMAGING — CR DG KNEE COMPLETE 4+V*R*
4 series · 4 of 4 positions shown · non-contrast
Comparison: None

CLINICAL DATA: Fell 3 days ago, RIGHT knee pain and swelling

EXAM:
RIGHT KNEE - COMPLETE 4+ VIEW

[knee ap]
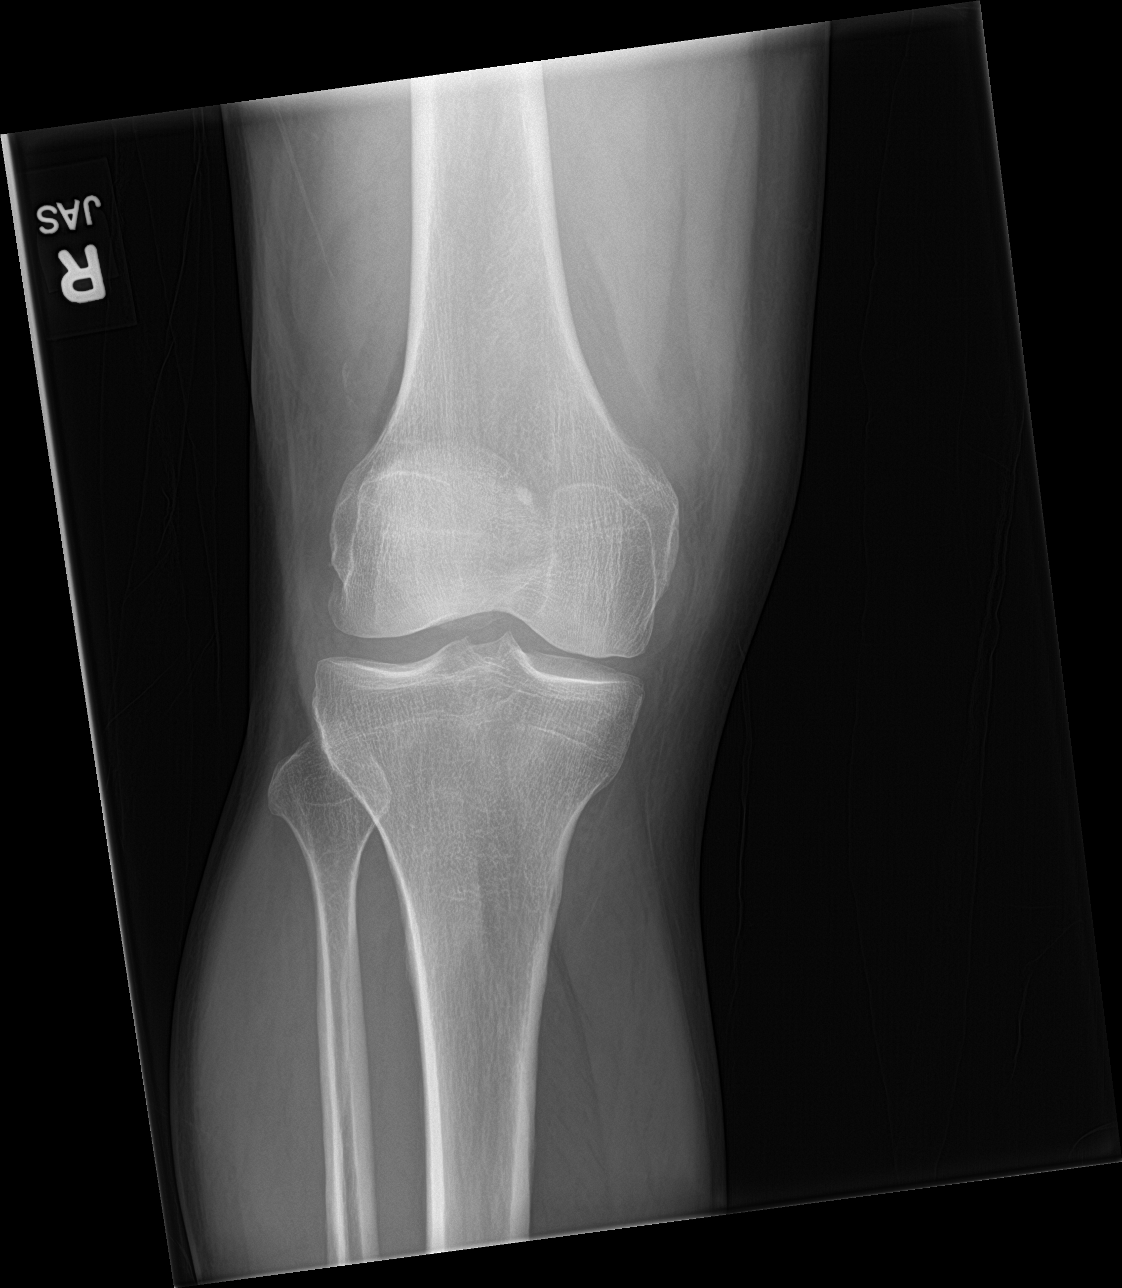

[knee lat]
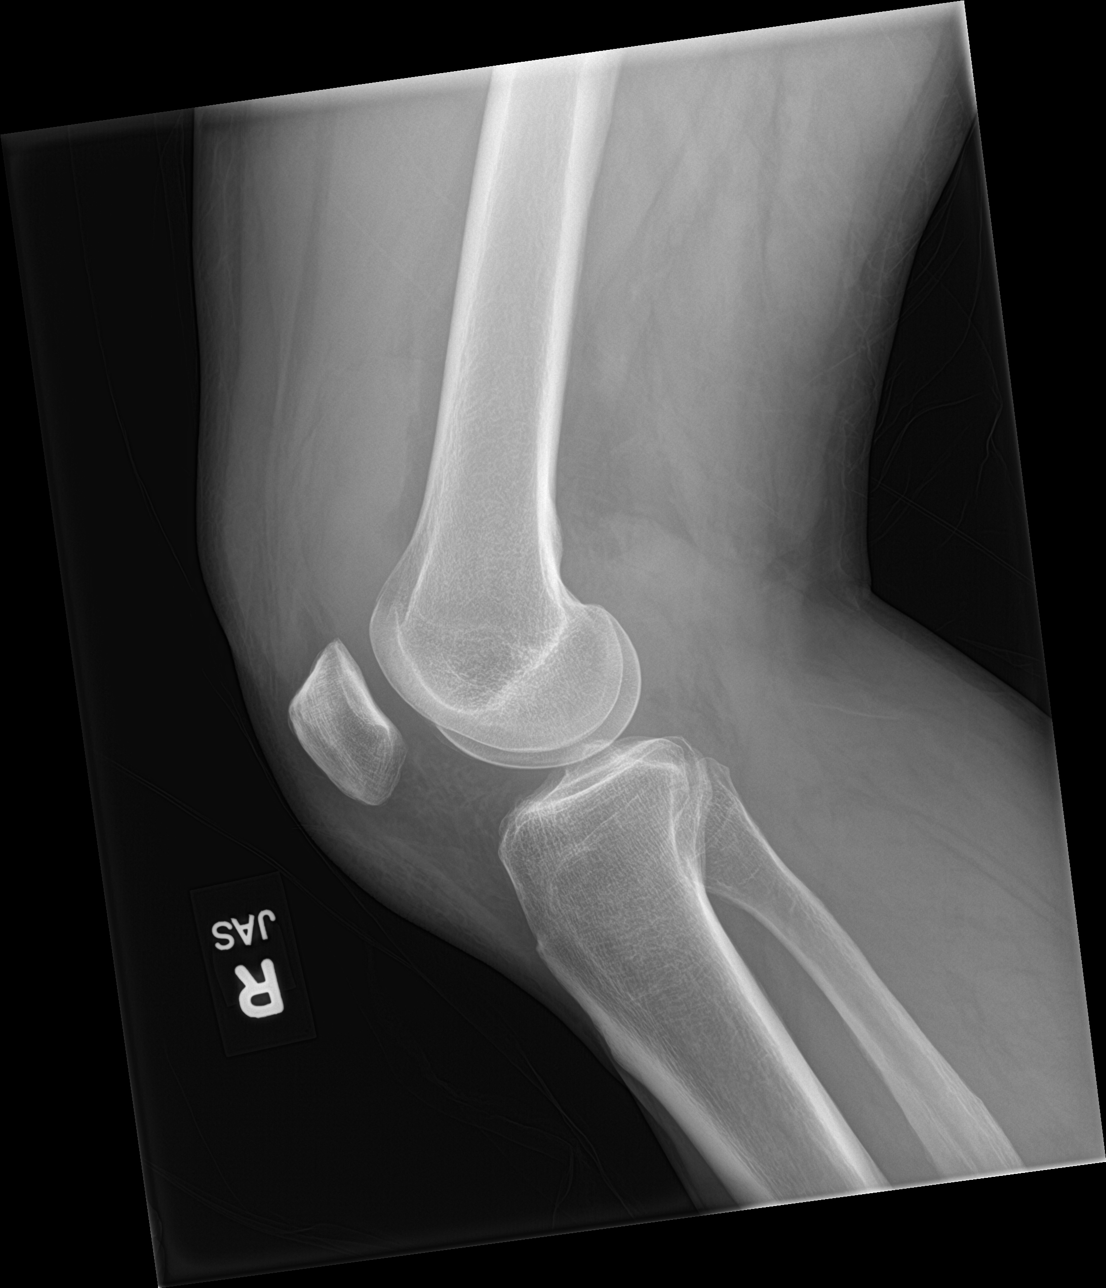

[knee obl (1 of 2)]
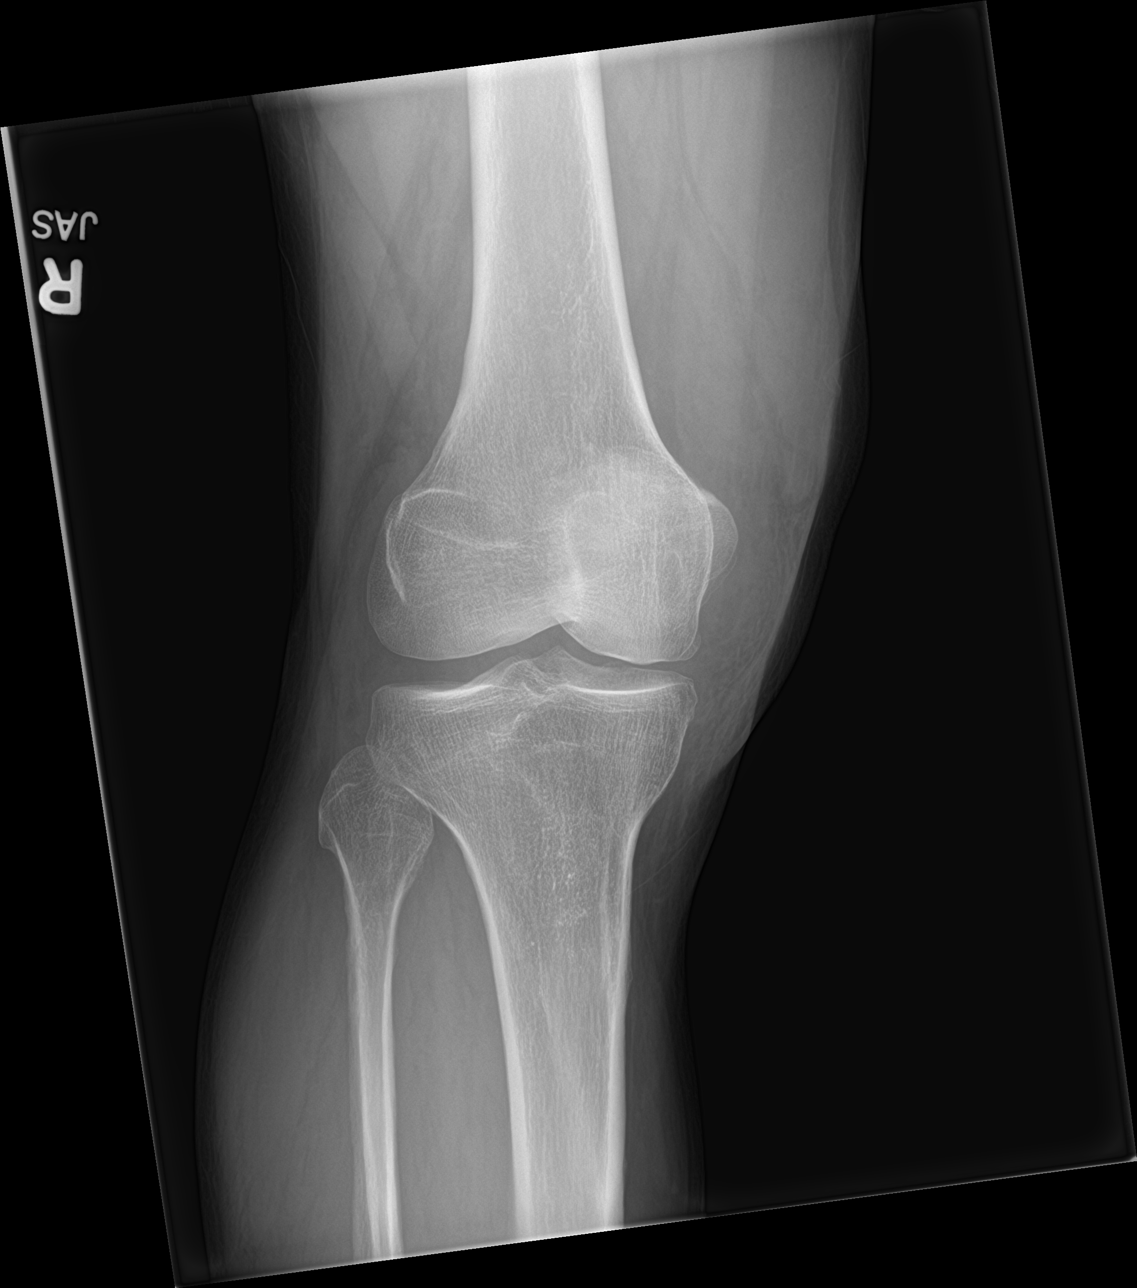

[knee obl (2 of 2)]
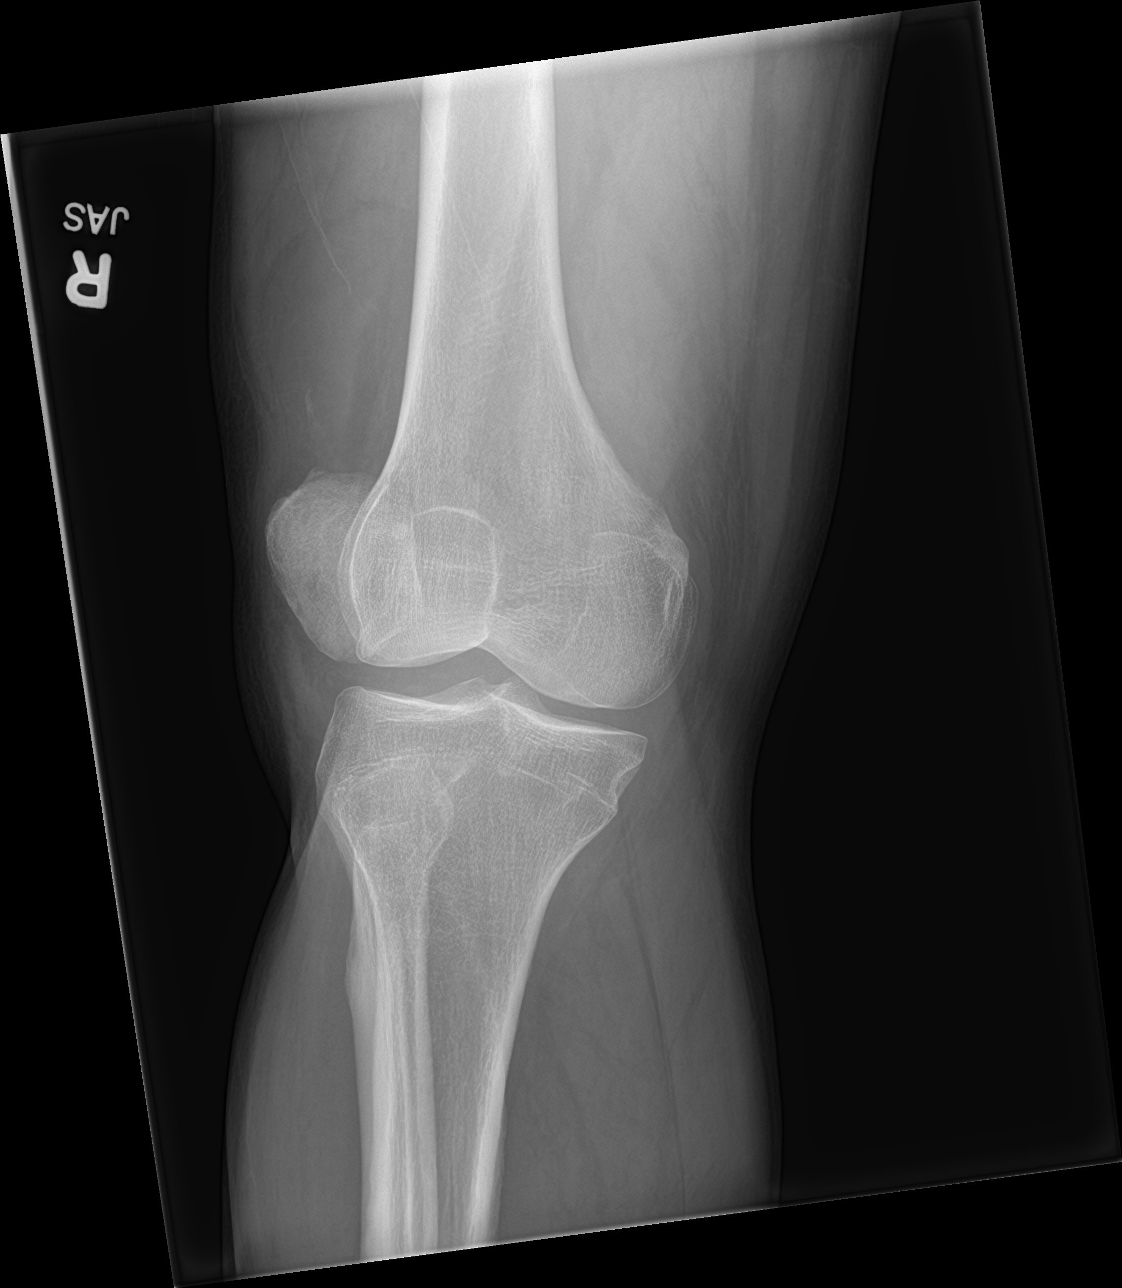

[4 of 4 positions shown; findings below may reference images not displayed]

FINDINGS: Osseous mineralization normal.

Joint spaces preserved.

Large knee joint effusion.

No acute fracture, dislocation, or bone destruction.

Anterior infrapatellar soft tissue swelling.
IMPRESSION: Large joint effusion.

No acute osseous abnormalities.

## 2022-01-08 ENCOUNTER — Other Ambulatory Visit: Payer: Self-pay | Admitting: Family Medicine

## 2022-01-08 ENCOUNTER — Ambulatory Visit
Admission: RE | Admit: 2022-01-08 | Discharge: 2022-01-08 | Disposition: A | Discharge status: Home / Self Care | Payer: Self-pay | Source: Ambulatory Visit | Attending: Family Medicine | Admitting: Family Medicine

## 2022-01-08 DIAGNOSIS — M25552 Pain in left hip: Secondary | ICD-10-CM

## 2022-01-08 DIAGNOSIS — M545 Low back pain, unspecified: Secondary | ICD-10-CM

## 2022-01-15 ENCOUNTER — Ambulatory Visit (INDEPENDENT_AMBULATORY_CARE_PROVIDER_SITE_OTHER): Payer: 59 | Admitting: Orthopedic Surgery

## 2022-01-15 DIAGNOSIS — M1612 Unilateral primary osteoarthritis, left hip: Secondary | ICD-10-CM | POA: Diagnosis not present

## 2022-01-20 ENCOUNTER — Encounter: Payer: Self-pay | Admitting: Orthopedic Surgery

## 2022-01-20 NOTE — Progress Notes (Signed)
Office Visit Note   Patient: Jon Ramos           Date of Birth: 13-Sep-1965           MRN: [id number] Visit Date: 01/15/2022 Requested by: No referring provider defined for this encounter. PCP: Patient, No Pcp Per  Subjective: Chief Complaint  Patient presents with   Left Hip - Pain    HPI: Jon Ramos is a 56 year old patient with long history of left hip pain worse over the past year.  Ambulates with a cane.  Tried a Land without relief.  Would prefer not to try any type of intra-articular injection or physical therapy.  Pain does not wake him from sleep at night but it is very severe.  Denies any numbness and tingling.  Patient really has no walking endurance.  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  He has no personal or family history of DVT or pulmonary embolism.  He has walked with a limp for over a year.  Prior injury 10 to 12 years ago with fall from ladder but not sure if that is related to his hip pain..              ROS: All systems reviewed are negative as they relate to the chief complaint within the history of present illness.  Patient denies  fevers or chills.   Assessment & Plan: Visit Diagnoses:  1. Arthritis of left hip     Plan: Impression is end-stage severe left hip arthritis.  Discussed with him operative and nonoperative treatment options.  Essentially his pain is severe and debilitating.  Bone-on-bone changes in the left hip joint with the right hip well-preserved.  Discussed with him the rationale and approach of direct anterior hip replacement.  The risk and benefits of the procedure are discussed with the patient including but not limited to infection nerve vessel damage leg length inequality as well as instability of the hip requiring further surgery.  Patient understands risk and benefits and would like to proceed with surgical intervention.  All questions answered.  Follow-Up Instructions: No follow-ups on file.   Orders:  No orders of the defined  types were placed in this encounter.  No orders of the defined types were placed in this encounter.     Procedures: No procedures performed   Clinical Data: No additional findings.  Objective: Vital Signs: There were no vitals taken for this visit.  Physical Exam:   Constitutional: Patient appears well-developed HEENT:  Head: Normocephalic Eyes:EOM are normal Neck: Normal range of motion Cardiovascular: Normal rate Pulmonary/chest: Effort normal Neurologic: Patient is alert Skin: Skin is warm Psychiatric: Patient has normal mood and affect   Ortho Exam: Ortho exam demonstrates full active and passive range of motion of the right hip.  Left hip has diminished internal rotation to only about 10 degrees.  Hip flexion strength is 5- out of 5 on the left compared to the right too likely to pain.  Abduction and adduction strength is symmetric.  No nerve root tension signs.  5 out of 5 ankle dorsiflexion plantarflexion strength.  No definite paresthesias L1-S1 bilaterally.  Trendelenburg gait is present.  Specialty Comments:  No specialty comments available.  Imaging: No results found.   PMFS History: Patient Active Problem List   Diagnosis Date Noted   Seizures (HCC) 01/01/2020   Alcohol abuse 01/01/2020   Bipolar 1 disorder (HCC) 12/31/2019   Syncope and collapse 12/25/2019   Bipolar 1 disorder with  moderate mania (HCC) 12/25/2019   Past Medical History:  Diagnosis Date   Bipolar 1 disorder (HCC)    Hypertension     History reviewed. No pertinent family history.  History reviewed. No pertinent surgical history. Social History   Occupational History   Not on file  Tobacco Use   Smoking status: Never   Smokeless tobacco: Never  Substance and Sexual Activity   Alcohol use: No   Drug use: No   Sexual activity: Not on file

## 2022-01-22 ENCOUNTER — Telehealth: Payer: Self-pay | Admitting: Orthopedic Surgery

## 2022-01-22 NOTE — Telephone Encounter (Signed)
You will have physical therapy after surgery.  Nothing really to prepare for prior to surgery other than eating well.  Out of work time would be 3 weeks plus or minus.  Length of recovery is 6 weeks to be walking without any assistive devices

## 2022-01-22 NOTE — Telephone Encounter (Signed)
Patient is scheduled for left total hip arthroplasty 02-15-22 at [facility].  Post op scheduled 03-01-22.  Patient would like to get an idea for the length of recovery.  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  He is asking how long he can expect to be out of work and when can he resume driving. Also he is asking if there is anything he can do to prepare for this surgery.  Will he have physical therapy after the surgery?  He will wait for a reply before contacting human resources at his workplace.  Call patient's cell  [phone].

## 2022-01-23 NOTE — Telephone Encounter (Signed)
IC no answer. LMVM advising per Dr Alfonso Patten note and to call back with any further questions.

## 2022-01-26 ENCOUNTER — Ambulatory Visit: Payer: Self-pay | Admitting: Orthopedic Surgery

## 2022-02-02 NOTE — Pre-Procedure Instructions (Signed)
Surgical Instructions    Your procedure is scheduled on February 15, 2022.  Report to [facility] Main Entrance "A" at 10:15 A.M., then check in with the Admitting office.  Call this number if you have problems the morning of surgery:  [phone]   If you have any questions prior to your surgery date call [phone]: Open Monday-Friday 8am-4pm    Remember:  Do not eat after midnight the night before your surgery  You may drink clear liquids until 9:15 AM the morning of your surgery.   Clear liquids allowed are: Water, Non-Citrus Juices (without pulp), Carbonated Beverages, Clear Tea, Black Coffee Only (NO MILK, CREAM OR POWDERED CREAMER of any kind), and Gatorade.    Take these medicines the morning of surgery with A SIP OF WATER:  NONE  As of today, STOP taking any Aspirin (unless otherwise instructed by your surgeon) Aleve, Naproxen, Ibuprofen, Motrin, Advil, Goody's, BC's, all herbal medications, fish oil, and all vitamins.                     Do NOT Smoke (Tobacco/Vaping) for 24 hours prior to your procedure.  If you use a CPAP at night, you may bring your mask/headgear for your overnight stay.   Contacts, glasses, piercing's, hearing aid's, dentures or partials may not be worn into surgery, please bring cases for these belongings.    For patients admitted to the hospital, discharge time will be determined by your treatment team.   Patients discharged the day of surgery will not be allowed to drive home, and someone needs to stay with them for 24 hours.  SURGICAL WAITING ROOM VISITATION Patients having surgery or a procedure may have no more than 2 support people in the waiting area - these visitors may rotate.   Children under the age of 44 must have an adult with them who is not the patient. If the patient needs to stay at the hospital during part of their recovery, the visitor guidelines for inpatient rooms apply. Pre-op nurse will coordinate an appropriate time for 1  support person to accompany patient in pre-op.  This support person may not rotate.   Please refer to the [facility] website for the visitor guidelines for Inpatients (after your surgery is over and you are in a regular room).    Special instructions:   [place]- Preparing For Surgery  Before surgery, you can play an important role. Because skin is not sterile, your skin needs to be as free of germs as possible. You can reduce the number of germs on your skin by washing with CHG (chlorahexidine gluconate) Soap before surgery.  CHG is an antiseptic cleaner which kills germs and bonds with the skin to continue killing germs even after washing.    Oral Hygiene is also important to reduce your risk of infection.  Remember - BRUSH YOUR TEETH THE MORNING OF SURGERY WITH YOUR REGULAR TOOTHPASTE  Please do not use if you have an allergy to CHG or antibacterial soaps. If your skin becomes reddened/irritated stop using the CHG.  Do not shave (including legs and underarms) for at least 48 hours prior to first CHG shower. It is OK to shave your face.  Please follow these instructions carefully.   Shower the NIGHT BEFORE SURGERY and the MORNING OF SURGERY  If you chose to wash your hair, wash your hair first as usual with your normal shampoo.  After you shampoo, rinse your hair and body thoroughly to remove the  shampoo.  Use CHG Soap as you would any other liquid soap. You can apply CHG directly to the skin and wash gently with a scrungie or a clean washcloth.   Apply the CHG Soap to your body ONLY FROM THE NECK DOWN.  Do not use on open wounds or open sores. Avoid contact with your eyes, ears, mouth and genitals (private parts). Wash Face and genitals (private parts)  with your normal soap.   Wash thoroughly, paying special attention to the area where your surgery will be performed.  Thoroughly rinse your body with warm water from the neck down.  DO NOT shower/wash with your normal soap after  using and rinsing off the CHG Soap.  Pat yourself dry with a CLEAN TOWEL.  Wear CLEAN PAJAMAS to bed the night before surgery  Place CLEAN SHEETS on your bed the night before your surgery  DO NOT SLEEP WITH PETS.   Day of Surgery: Take a shower with CHG soap. Do not wear jewelry or makeup Do not wear lotions, powders, perfumes/colognes, or deodorant. Do not shave 48 hours prior to surgery.  Men may shave face and neck. Do not bring valuables to the hospital.  [facility] is not responsible for any belongings or valuables.   Wear Clean/Comfortable clothing the morning of surgery Remember to brush your teeth WITH YOUR REGULAR TOOTHPASTE.   Please read over the following fact sheets that you were given.    If you received a COVID test during your pre-op visit  it is requested that you wear a mask when out in public, stay away from anyone that may not be feeling well and notify your surgeon if you develop symptoms. If you have been in contact with anyone that has tested positive in the last 10 days please notify you surgeon.

## 2022-02-05 ENCOUNTER — Other Ambulatory Visit: Payer: Self-pay

## 2022-02-05 ENCOUNTER — Encounter (HOSPITAL_COMMUNITY)
Admission: RE | Admit: 2022-02-05 | Discharge: 2022-02-05 | Disposition: A | Discharge status: Home / Self Care | Payer: 59 | Source: Ambulatory Visit | Attending: Orthopedic Surgery | Admitting: Orthopedic Surgery

## 2022-02-05 ENCOUNTER — Encounter (HOSPITAL_COMMUNITY): Payer: Self-pay

## 2022-02-05 VITALS — BP 148/104 | HR 79 | Temp 98.3°F | Resp 18 | Ht 71.0 in | Wt 219.7 lb

## 2022-02-05 DIAGNOSIS — Z01818 Encounter for other preprocedural examination: Secondary | ICD-10-CM

## 2022-02-05 LAB — TYPE AND SCREEN
ABO/RH(D): A POS
Antibody Screen: NEGATIVE

## 2022-02-05 LAB — CBC
HCT: 45.9 % (ref 39.0–52.0)
Hemoglobin: 15.3 g/dL (ref 13.0–17.0)
MCH: 32.3 pg (ref 26.0–34.0)
MCHC: 33.3 g/dL (ref 30.0–36.0)
MCV: 96.8 fL (ref 80.0–100.0)
Platelets: 160 10*3/uL (ref 150–400)
RBC: 4.74 MIL/uL (ref 4.22–5.81)
RDW: 13.6 % (ref 11.5–15.5)
WBC: 4.1 10*3/uL (ref 4.0–10.5)
nRBC: 0 % (ref 0.0–0.2)

## 2022-02-05 LAB — BASIC METABOLIC PANEL
Anion gap: 7 (ref 5–15)
BUN: 16 mg/dL (ref 6–20)
CO2: 28 mmol/L (ref 22–32)
Calcium: 9.8 mg/dL (ref 8.9–10.3)
Chloride: 103 mmol/L (ref 98–111)
Creatinine, Ser: 1.1 mg/dL (ref 0.61–1.24)
GFR, Estimated: >60 mL/min (ref >60)
Glucose, Bld: 93 mg/dL (ref 70–99)
Potassium: 4.6 mmol/L (ref 3.5–5.1)
Sodium: 138 mmol/L (ref 135–145)

## 2022-02-05 LAB — URINALYSIS, ROUTINE W REFLEX MICROSCOPIC
Bilirubin Urine: NEGATIVE
Glucose, UA: NEGATIVE mg/dL
Hgb urine dipstick: NEGATIVE
Ketones, ur: NEGATIVE mg/dL
Leukocytes,Ua: NEGATIVE
Nitrite: NEGATIVE
Protein, ur: NEGATIVE mg/dL
Specific Gravity, Urine: 1.011 (ref 1.005–1.030)
pH: 6 (ref 5.0–8.0)

## 2022-02-05 LAB — SURGICAL PCR SCREEN
MRSA, PCR: NEGATIVE
Staphylococcus aureus: POSITIVE — AB

## 2022-02-05 NOTE — Pre-Procedure Instructions (Signed)
Surgical Instructions    Your procedure is scheduled on February 15, 2022.  Report to [facility] Main Entrance "A" at 10:15 A.M., then check in with the Admitting office.  Call this number if you have problems the morning of surgery:  [phone]   If you have any questions prior to your surgery date call [phone]: Open Monday-Friday 8am-4pm    Remember:  Do not eat after midnight the night before your surgery  You may drink clear liquids until 9:15 AM the morning of your surgery.   Clear liquids allowed are: Water, Non-Citrus Juices (without pulp), Carbonated Beverages, Clear Tea, Black Coffee Only (NO MILK, CREAM OR POWDERED CREAMER of any kind), and Gatorade.   Enhanced Recovery after Surgery for Orthopedics Enhanced Recovery after Surgery is a protocol used to improve the stress on your body and your recovery after surgery.  Patient Instructions  The day of surgery (if you do NOT have diabetes):  Drink ONE (1) Pre-Surgery Clear Ensure by 9:15 am the morning of surgery   This drink was given to you during your hospital  pre-op appointment visit. Nothing else to drink after completing the  Pre-Surgery Clear Ensure.         If you have questions, please contact your surgeon's office.     Take these medicines the morning of surgery with A SIP OF WATER:  NONE  As of today, STOP taking any Aspirin (unless otherwise instructed by your surgeon) Aleve, Naproxen, Ibuprofen, Motrin, Advil, Goody's, BC's, all herbal medications, fish oil, and all vitamins.                     Do NOT Smoke (Tobacco/Vaping) for 24 hours prior to your procedure.  If you use a CPAP at night, you may bring your mask/headgear for your overnight stay.   Contacts, glasses, piercing's, hearing aid's, dentures or partials may not be worn into surgery, please bring cases for these belongings.    For patients admitted to the hospital, discharge time will be determined by your treatment team.   Patients  discharged the day of surgery will not be allowed to drive home, and someone needs to stay with them for 24 hours.  SURGICAL WAITING ROOM VISITATION Patients having surgery or a procedure may have no more than 2 support people in the waiting area - these visitors may rotate.   Children under the age of 65 must have an adult with them who is not the patient. If the patient needs to stay at the hospital during part of their recovery, the visitor guidelines for inpatient rooms apply. Pre-op nurse will coordinate an appropriate time for 1 support person to accompany patient in pre-op.  This support person may not rotate.   Please refer to the [facility] website for the visitor guidelines for Inpatients (after your surgery is over and you are in a regular room).    Special instructions:   [place]- Preparing For Surgery  Before surgery, you can play an important role. Because skin is not sterile, your skin needs to be as free of germs as possible. You can reduce the number of germs on your skin by washing with CHG (chlorahexidine gluconate) Soap before surgery.  CHG is an antiseptic cleaner which kills germs and bonds with the skin to continue killing germs even after washing.    Oral Hygiene is also important to reduce your risk of infection.  Remember - BRUSH YOUR TEETH THE MORNING OF SURGERY WITH YOUR  REGULAR TOOTHPASTE  Please do not use if you have an allergy to CHG or antibacterial soaps. If your skin becomes reddened/irritated stop using the CHG.  Do not shave (including legs and underarms) for at least 48 hours prior to first CHG shower. It is OK to shave your face.  Please follow these instructions carefully.   Shower the NIGHT BEFORE SURGERY and the MORNING OF SURGERY  If you chose to wash your hair, wash your hair first as usual with your normal shampoo.  After you shampoo, rinse your hair and body thoroughly to remove the shampoo.  Use CHG Soap as you would any other liquid  soap. You can apply CHG directly to the skin and wash gently with a scrungie or a clean washcloth.   Apply the CHG Soap to your body ONLY FROM THE NECK DOWN.  Do not use on open wounds or open sores. Avoid contact with your eyes, ears, mouth and genitals (private parts). Wash Face and genitals (private parts)  with your normal soap.   Wash thoroughly, paying special attention to the area where your surgery will be performed.  Thoroughly rinse your body with warm water from the neck down.  DO NOT shower/wash with your normal soap after using and rinsing off the CHG Soap.  Pat yourself dry with a CLEAN TOWEL.  Wear CLEAN PAJAMAS to bed the night before surgery  Place CLEAN SHEETS on your bed the night before your surgery  DO NOT SLEEP WITH PETS.   Day of Surgery: Take a shower with CHG soap. Do not wear jewelry Do not wear lotions, powders, colognes, or deodorant. Men may shave face and neck. Do not bring valuables to the hospital.  [facility] is not responsible for any belongings or valuables.   Wear Clean/Comfortable clothing the morning of surgery Remember to brush your teeth WITH YOUR REGULAR TOOTHPASTE.   Please read over the following fact sheets that you were given.    If you received a COVID test during your pre-op visit  it is requested that you wear a mask when out in public, stay away from anyone that may not be feeling well and notify your surgeon if you develop symptoms. If you have been in contact with anyone that has tested positive in the last 10 days please notify you surgeon.

## 2022-02-05 NOTE — Progress Notes (Signed)
PCP - Dr. Casimiro Needle [facility] Cardiologist - denies  PPM/ICD - denies  Chest x-ray - n/a EKG - 02/05/22 Stress Test - denies ECHO - 12/26/19 Cardiac Cath - denies  Sleep Study - denies CPAP - denies  Blood Thinner Instructions: n/a Aspirin Instructions: n/a  ERAS Protcol -Clear liquids until 0915 DOS PRE-SURGERY Ensure or G2- Ensure provided  COVID TEST- n/a  Anesthesia review: No, Shonna Chock, PA-C reviewed EKG during PAT visit. No new orders at this time.   Patient denies shortness of breath, fever, cough and chest pain at PAT appointment   All instructions explained to the patient, with a verbal understanding of the material. Patient agrees to go over the instructions while at home for a better understanding. Patient also instructed to self quarantine after being tested for COVID-19. The opportunity to ask questions was provided.

## 2022-02-06 LAB — URINE CULTURE: Culture: NO GROWTH

## 2022-02-07 NOTE — Progress Notes (Addendum)
Anesthesia Chart Review:  Case: [id number] Date/Time: 02/15/22 1200   Procedure: LEFT TOTAL HIP ARTHROPLASTY ANTERIOR APPROACH (Left: Hip)   Anesthesia type: Spinal   Pre-op diagnosis: left hip osteoarthritis   Location: [facility] OR ROOM 06 / [facility] OR   Surgeons: Cammy Copa, MD       DISCUSSION: Patient is a 56 year old male scheduled for the above procedure.  History includes never smoker, HTN, Bipolar 1 disorder.  BP 148/104 at PAT. No other recent BP readings available. I attempted to contact Mr. Hakeem to inquire if he monitors BP at home (known HTN history but do not see that he is on any anti-hypertensive medications), but only got his voice message. I have sent communication to Debbie at Dr. Diamantina [facility] office regarding this.  He denied SOB and chest pain at PAT RN visit. EKG showed SR, incomplete RBBB, non-specific T wave abnormality. 12/2019 echo showed normal LV/RV systolic function, EF 55-60%, trivial MR, ascending aorta 3.90 cm. PAT CBC, BMET and urine culture were normal.  UPDATE 02/12/22 9:13 AM:  Eunice Blase followed up with Mr. Hagood. He reported being on BP medication in 2021, but it was stopped when his BP improved. He does not monitor BP at home. He has only seen Dr. Prince Rome once, so would not have available BP trends.  She reviewed with orthopedic provider(s), and he was prescribed amlodipine 5 mg daily leading up to surgery. He will get vitals on arrival for surgery and anesthesia team evaluation at that time.     VS: BP (!) 148/104   Pulse 79   Temp 36.8 C (Oral)   Resp 18   Ht 5\' 11"  (1.803 m)   Wt 99.7 kg   SpO2 100%   BMI 30.64 kg/m  BP Readings from Last 3 Encounters:  02/05/22 (!) 148/104  12/31/19 (!) 152/109  12/25/19 (!) 148/99     PROVIDERS: PCP is Dr. 12/27/19 with [facility]   LABS: Labs reviewed: Acceptable for surgery. (all labs ordered are listed, but only abnormal results are displayed)  Labs Reviewed  SURGICAL PCR SCREEN -  Abnormal; Notable for the following components:      Result Value   Staphylococcus aureus POSITIVE (*)    All other components within normal limits  URINE CULTURE  CBC  BASIC METABOLIC PANEL  URINALYSIS, ROUTINE W REFLEX MICROSCOPIC  TYPE AND SCREEN     IMAGES: Xray L-spine 01/08/22: FINDINGS: Lumbar vertebral body height are preserved without evidence of fracture. Minimal grade 1 retrolisthesis of L2 on L3. Mild intervertebral disc space narrowing at L3-L4 and L4-L5. Facet arthropathy most significant from L4-S1. IMPRESSION: Degenerative changes with no acute fracture identified.  Xray left hip 01/08/22: IMPRESSION: No fracture or dislocation. Moderately severe degenerative changes at the left hip joint.   EKG: 02/05/22 Normal sinus rhythm Incomplete right bundle branch block Nonspecific T wave abnormality Abnormal ECG When compared with ECG of 25-Dec-2019 14:21, PREVIOUS ECG IS PRESENT Confirmed by 27-Dec-2019 (Weston Brass) on 02/07/2022 3:21:24 PM   CV: Echo 12/26/19: IMPRESSIONS   1. Left ventricular ejection fraction, by estimation, is 55 to 60%. The  left ventricle has normal function. The left ventricle has no regional  wall motion abnormalities. There is moderate left ventricular hypertrophy.  Left ventricular diastolic  parameters were normal.   2. Right ventricular systolic function is normal. The right ventricular  size is normal. Tricuspid regurgitation signal is inadequate for assessing  PA pressure.   3. Left atrial size  was upper normal.   4. The mitral valve is grossly normal. Trivial mitral valve  regurgitation.   5. The aortic valve is tricuspid. Aortic valve regurgitation is not  visualized. Mild aortic valve sclerosis is present, with no evidence of  aortic valve stenosis.   6. Aortic dilatation noted. There is mild dilatation of the aortic root  and of the ascending aorta. Ao Root diam: 3.80 cm. Ao Asc diam:  3.90 cm.   7. Unable to estimate CVP.     US Carotid 12/26/19: Summary:  Right Carotid: Velocities in the right ICA are consistent with a 1-39%  stenosis.  Left Carotid: Velocities in the left ICA are consistent with a 1-39%  stenosis.  Vertebrals: Bilateral vertebral arteries demonstrate antegrade flow.    Past Medical History:  Diagnosis Date   Bipolar 1 disorder (HCC)    Hypertension     Past Surgical History:  Procedure Laterality Date   TYMPANOSTOMY TUBE PLACEMENT     as a child    MEDICATIONS:  divalproex (DEPAKOTE ER) 250 MG 24 hr tablet   divalproex (DEPAKOTE ER) 500 MG 24 hr tablet   lithium carbonate (ESKALITH) 450 MG CR tablet   QUEtiapine (SEROQUEL) 200 MG tablet   QUEtiapine (SEROQUEL) 300 MG tablet   zinc gluconate 50 MG tablet   No current facility-administered medications for this encounter.    Shonna Chock, PA-C Surgical Short Stay/Anesthesiology [facility] Phone [phone] [facility] Phone [phone] 02/07/2022 6:49 PM

## 2022-02-07 NOTE — Anesthesia Preprocedure Evaluation (Addendum)
Anesthesia Evaluation  Patient identified by MRN, date of birth, ID band Patient awake    Reviewed: Allergy & Precautions, NPO status , Patient's Chart, lab work & pertinent test results  History of Anesthesia Complications Negative for: history of anesthetic complications  Airway Mallampati: II  TM Distance: >3 FB Neck ROM: Full    Dental  (+) Teeth Intact   Pulmonary neg pulmonary ROS,    Pulmonary exam normal        Cardiovascular hypertension, Normal cardiovascular exam     Neuro/Psych Seizures -,  Bipolar Disorder    GI/Hepatic negative GI ROS, Neg liver ROS,   Endo/Other  negative endocrine ROS  Renal/GU negative Renal ROS  negative genitourinary   Musculoskeletal negative musculoskeletal ROS (+)   Abdominal   Peds  Hematology negative hematology ROS (+)   Anesthesia Other Findings   Reproductive/Obstetrics                          Anesthesia Physical Anesthesia Plan  ASA: 2  Anesthesia Plan: Spinal   Post-op Pain Management: Tylenol PO (pre-op)* and Toradol IV (intra-op)*   Induction:   PONV Risk Score and Plan: 1 and Propofol infusion, Treatment may vary due to age or medical condition, Ondansetron and TIVA  Airway Management Planned: Nasal Cannula and Simple Face Mask  Additional Equipment: None  Intra-op Plan:   Post-operative Plan:   Informed Consent: I have reviewed the patients History and Physical, chart, labs and discussed the procedure including the risks, benefits and alternatives for the proposed anesthesia with the patient or authorized representative who has indicated his/her understanding and acceptance.       Plan Discussed with:   Anesthesia Plan Comments: (PAT note written by Shonna Chock, PA-C. )      Anesthesia Quick Evaluation

## 2022-02-08 ENCOUNTER — Telehealth: Payer: Self-pay | Admitting: Orthopedic Surgery

## 2022-02-08 NOTE — Telephone Encounter (Signed)
Have him take whatever he was on before when he stopped just so not cancelled thx

## 2022-02-08 NOTE — Telephone Encounter (Signed)
Revonda Standard with anesthesia/Short Stay sent a message yesterday evening regarding this patient.  He is scheduled for Left Total Hip 02-15-22 and had pre-admission visit on 02/05/22.  Revonda Standard was asked to review patient's EKG.  Patient's BP was 148/104 at his PAT.  He sees Dr. Prince Rome.  According to patient's medication list he is not on any BP medicine.  Revonda Standard called patient but it went to voice mail.  She was going to have him monitor his BP at home to see what the readings would be or find out what the BP trends are from PCP. Patient could still have surgery with 148/104 reading---but if much high it could be an issue.     I called patient to ask if he has had any issues with his BP. He said no.  According to the patient, he had a hospital stay in 2021 and was put on BP while there, but said when the BP went down they took him off.  Patient has only seen Dr. Prince Rome one time so there would be no trends with Dr. Prince Rome.  Patient does not have a BP monitor at home.  PLEASE ADVISE.

## 2022-02-09 ENCOUNTER — Other Ambulatory Visit: Payer: Self-pay | Admitting: Surgical

## 2022-02-09 MED ORDER — AMLODIPINE BESYLATE 5 MG PO TABS: 5.0000 mg | ORAL_TABLET | Freq: Every day | ORAL | 0 refills | Status: DC

## 2022-02-09 NOTE — Progress Notes (Signed)
Patient had hypertension noted at preop appointment.  Sending in prescription for amlodipine 5 mg to take daily leading up to surgery in order to minimize hypertension that would lead to cancellation of procedure.  Patient was informed and he agreed to this plan.  He has tolerated this medication in the past without difficulty.

## 2022-02-12 NOTE — Telephone Encounter (Signed)
Yeah, I sent in BP med and discussed with Fayrene Fearing

## 2022-02-15 ENCOUNTER — Ambulatory Visit (HOSPITAL_COMMUNITY): Payer: 59 | Admitting: Vascular Surgery

## 2022-02-15 ENCOUNTER — Other Ambulatory Visit: Payer: Self-pay

## 2022-02-15 ENCOUNTER — Observation Stay (HOSPITAL_COMMUNITY): Payer: 59

## 2022-02-15 ENCOUNTER — Observation Stay (HOSPITAL_COMMUNITY)
Admission: RE | Admit: 2022-02-15 | Discharge: 2022-02-16 | Disposition: A | Discharge status: Home / Self Care | Payer: 59 | Attending: Orthopedic Surgery | Admitting: Orthopedic Surgery

## 2022-02-15 ENCOUNTER — Encounter (HOSPITAL_COMMUNITY): Payer: Self-pay | Admitting: Orthopedic Surgery

## 2022-02-15 ENCOUNTER — Ambulatory Visit (HOSPITAL_BASED_OUTPATIENT_CLINIC_OR_DEPARTMENT_OTHER): Payer: 59 | Admitting: Anesthesiology

## 2022-02-15 ENCOUNTER — Ambulatory Visit (HOSPITAL_COMMUNITY): Payer: 59

## 2022-02-15 ENCOUNTER — Encounter (HOSPITAL_COMMUNITY)
Admission: RE | Disposition: A | Discharge status: Home / Self Care | Payer: Self-pay | Source: Home / Self Care | Attending: Orthopedic Surgery

## 2022-02-15 DIAGNOSIS — I10 Essential (primary) hypertension: Secondary | ICD-10-CM | POA: Insufficient documentation

## 2022-02-15 DIAGNOSIS — M1612 Unilateral primary osteoarthritis, left hip: Principal | ICD-10-CM

## 2022-02-15 DIAGNOSIS — Z96642 Presence of left artificial hip joint: Secondary | ICD-10-CM

## 2022-02-15 DIAGNOSIS — Z01818 Encounter for other preprocedural examination: Secondary | ICD-10-CM

## 2022-02-15 HISTORY — PX: TOTAL HIP ARTHROPLASTY: SHX124

## 2022-02-15 LAB — ABO/RH: ABO/RH(D): A POS

## 2022-02-15 SURGERY — ARTHROPLASTY, HIP, TOTAL, ANTERIOR APPROACH
Anesthesia: Spinal | Site: Hip | Laterality: Left

## 2022-02-15 MED ORDER — DIVALPROEX SODIUM ER 250 MG PO TB24
250.0000 mg | ORAL_TABLET | Freq: Every day | ORAL | Status: DC
  Administered 2022-02-15: 250 mg via ORAL
  Filled 2022-02-15 (×3): qty 1

## 2022-02-15 MED ORDER — OXYCODONE HCL 5 MG PO TABS: 5.0000 mg | ORAL_TABLET | Freq: Once | ORAL | Status: DC | PRN

## 2022-02-15 MED ORDER — ONDANSETRON HCL 4 MG/2ML IJ SOLN: 4.0000 mg | Freq: Four times a day (QID) | INTRAMUSCULAR | Status: DC | PRN

## 2022-02-15 MED ORDER — CEFAZOLIN SODIUM-DEXTROSE 2-4 GM/100ML-% IV SOLN
2.0000 g | Freq: Three times a day (TID) | INTRAVENOUS | Status: AC
  Administered 2022-02-15 – 2022-02-16 (×2): 2 g via INTRAVENOUS
  Filled 2022-02-15 (×2): qty 100

## 2022-02-15 MED ORDER — POVIDONE-IODINE 10 % EX SWAB
2.0000 | Freq: Once | CUTANEOUS | Status: AC
  Administered 2022-02-15: 2 via TOPICAL

## 2022-02-15 MED ORDER — SODIUM CHLORIDE 0.9 % IV SOLN: INTRAVENOUS | Status: AC

## 2022-02-15 MED ORDER — SODIUM CHLORIDE 0.9 % IV SOLN
INTRAVENOUS | Status: DC | PRN
  Administered 2022-02-15: 40 mL

## 2022-02-15 MED ORDER — FENTANYL CITRATE (PF) 250 MCG/5ML IJ SOLN
INTRAMUSCULAR | Status: AC
  Filled 2022-02-15: qty 5

## 2022-02-15 MED ORDER — HYDROMORPHONE HCL 1 MG/ML IJ SOLN: 0.5000 mg | INTRAMUSCULAR | Status: DC | PRN

## 2022-02-15 MED ORDER — ALBUMIN HUMAN 5 % IV SOLN
INTRAVENOUS | Status: AC
  Filled 2022-02-15: qty 250

## 2022-02-15 MED ORDER — MIDAZOLAM HCL 2 MG/2ML IJ SOLN
INTRAMUSCULAR | Status: DC | PRN
  Administered 2022-02-15: 2 mg via INTRAVENOUS

## 2022-02-15 MED ORDER — BUPIVACAINE LIPOSOME 1.3 % IJ SUSP
INTRAMUSCULAR | Status: AC
  Filled 2022-02-15: qty 20

## 2022-02-15 MED ORDER — DIVALPROEX SODIUM ER 500 MG PO TB24
1000.0000 mg | ORAL_TABLET | Freq: Every day | ORAL | Status: DC
  Administered 2022-02-15: 1000 mg via ORAL
  Filled 2022-02-15 (×4): qty 2

## 2022-02-15 MED ORDER — POVIDONE-IODINE 7.5 % EX SOLN
Freq: Once | CUTANEOUS | Status: DC
  Filled 2022-02-15: qty 118

## 2022-02-15 MED ORDER — MIDAZOLAM HCL 2 MG/2ML IJ SOLN
INTRAMUSCULAR | Status: AC
Start: 2022-02-15 — End: ?
  Filled 2022-02-15: qty 2

## 2022-02-15 MED ORDER — VANCOMYCIN HCL 1000 MG IV SOLR
INTRAVENOUS | Status: DC | PRN
  Administered 2022-02-15: 1000 mg via TOPICAL

## 2022-02-15 MED ORDER — FENTANYL CITRATE (PF) 100 MCG/2ML IJ SOLN: 25.0000 ug | INTRAMUSCULAR | Status: DC | PRN

## 2022-02-15 MED ORDER — PROPOFOL 500 MG/50ML IV EMUL
INTRAVENOUS | Status: DC | PRN
  Administered 2022-02-15: 75 ug/kg/min via INTRAVENOUS

## 2022-02-15 MED ORDER — DOCUSATE SODIUM 100 MG PO CAPS: 100.0000 mg | ORAL_CAPSULE | Freq: Two times a day (BID) | ORAL | Status: DC

## 2022-02-15 MED ORDER — LACTATED RINGERS IV SOLN: INTRAVENOUS | Status: DC

## 2022-02-15 MED ORDER — PHENOL 1.4 % MT LIQD: 1.0000 | OROMUCOSAL | Status: DC | PRN

## 2022-02-15 MED ORDER — VANCOMYCIN HCL 1000 MG IV SOLR
INTRAVENOUS | Status: AC
  Filled 2022-02-15: qty 20

## 2022-02-15 MED ORDER — METHOCARBAMOL 1000 MG/10ML IJ SOLN: 500.0000 mg | Freq: Four times a day (QID) | INTRAVENOUS | Status: DC | PRN

## 2022-02-15 MED ORDER — ACETAMINOPHEN 325 MG PO TABS: 325.0000 mg | ORAL_TABLET | Freq: Four times a day (QID) | ORAL | Status: DC | PRN

## 2022-02-15 MED ORDER — ONDANSETRON HCL 4 MG/2ML IJ SOLN: 4.0000 mg | Freq: Once | INTRAMUSCULAR | Status: DC | PRN

## 2022-02-15 MED ORDER — CEFAZOLIN SODIUM-DEXTROSE 2-3 GM-%(50ML) IV SOLR
INTRAVENOUS | Status: DC | PRN
  Administered 2022-02-15: 2 g via INTRAVENOUS

## 2022-02-15 MED ORDER — CEFAZOLIN SODIUM 1 G IJ SOLR
INTRAMUSCULAR | Status: AC
Start: 2022-02-15 — End: ?
  Filled 2022-02-15: qty 20

## 2022-02-15 MED ORDER — ASPIRIN 81 MG PO CHEW
81.0000 mg | CHEWABLE_TABLET | Freq: Two times a day (BID) | ORAL | Status: DC
Start: 2022-02-16 — End: 2022-02-17
  Administered 2022-02-16: 81 mg via ORAL
  Filled 2022-02-15: qty 1

## 2022-02-15 MED ORDER — AMISULPRIDE (ANTIEMETIC) 5 MG/2ML IV SOLN: 10.0000 mg | Freq: Once | INTRAVENOUS | Status: DC | PRN

## 2022-02-15 MED ORDER — FENTANYL CITRATE (PF) 250 MCG/5ML IJ SOLN
INTRAMUSCULAR | Status: DC | PRN
  Administered 2022-02-15: 25 ug via INTRAVENOUS
  Administered 2022-02-15: 50 ug via INTRAVENOUS
  Administered 2022-02-15: 25 ug via INTRAVENOUS

## 2022-02-15 MED ORDER — PHENYLEPHRINE HCL-NACL 20-0.9 MG/250ML-% IV SOLN
INTRAVENOUS | Status: DC | PRN
  Administered 2022-02-15: 25 ug/min via INTRAVENOUS

## 2022-02-15 MED ORDER — ACETAMINOPHEN 500 MG PO TABS
1000.0000 mg | ORAL_TABLET | Freq: Four times a day (QID) | ORAL | Status: DC
  Administered 2022-02-15: 1000 mg via ORAL
  Filled 2022-02-15: qty 2

## 2022-02-15 MED ORDER — LITHIUM CARBONATE ER 450 MG PO TBCR
450.0000 mg | EXTENDED_RELEASE_TABLET | Freq: Every day | ORAL | Status: DC
  Administered 2022-02-15: 450 mg via ORAL
  Filled 2022-02-15 (×4): qty 1

## 2022-02-15 MED ORDER — CHLORHEXIDINE GLUCONATE 0.12 % MT SOLN
15.0000 mL | Freq: Once | OROMUCOSAL | Status: AC
  Administered 2022-02-15: 15 mL via OROMUCOSAL
  Filled 2022-02-15: qty 15

## 2022-02-15 MED ORDER — OXYCODONE HCL 5 MG PO TABS
5.0000 mg | ORAL_TABLET | ORAL | Status: DC | PRN
  Administered 2022-02-15: 10 mg via ORAL
  Filled 2022-02-15: qty 2

## 2022-02-15 MED ORDER — METOCLOPRAMIDE HCL 5 MG PO TABS: 5.0000 mg | ORAL_TABLET | Freq: Three times a day (TID) | ORAL | Status: DC | PRN

## 2022-02-15 MED ORDER — ONDANSETRON HCL 4 MG PO TABS: 4.0000 mg | ORAL_TABLET | Freq: Four times a day (QID) | ORAL | Status: DC | PRN

## 2022-02-15 MED ORDER — OXYCODONE HCL 5 MG/5ML PO SOLN: 5.0000 mg | Freq: Once | ORAL | Status: DC | PRN

## 2022-02-15 MED ORDER — MORPHINE SULFATE 4 MG/ML IJ SOLN
INTRAMUSCULAR | Status: DC | PRN
  Administered 2022-02-15: 8 mg

## 2022-02-15 MED ORDER — ACETAMINOPHEN 500 MG PO TABS
1000.0000 mg | ORAL_TABLET | Freq: Once | ORAL | Status: AC
  Administered 2022-02-15: 1000 mg via ORAL
  Filled 2022-02-15: qty 2

## 2022-02-15 MED ORDER — NAPROXEN 250 MG PO TABS
250.0000 mg | ORAL_TABLET | Freq: Two times a day (BID) | ORAL | Status: DC
  Administered 2022-02-16 (×2): 250 mg via ORAL
  Filled 2022-02-15 (×2): qty 1

## 2022-02-15 MED ORDER — TRANEXAMIC ACID-NACL 1000-0.7 MG/100ML-% IV SOLN
1000.0000 mg | Freq: Once | INTRAVENOUS | Status: AC
Start: 2022-02-15 — End: 2022-02-15
  Administered 2022-02-15: 1000 mg via INTRAVENOUS
  Filled 2022-02-15: qty 100

## 2022-02-15 MED ORDER — MENTHOL 3 MG MT LOZG: 1.0000 | LOZENGE | OROMUCOSAL | Status: DC | PRN

## 2022-02-15 MED ORDER — TRANEXAMIC ACID-NACL 1000-0.7 MG/100ML-% IV SOLN
INTRAVENOUS | Status: DC | PRN
  Administered 2022-02-15: 1000 mg via INTRAVENOUS

## 2022-02-15 MED ORDER — METHOCARBAMOL 500 MG PO TABS: 500.0000 mg | ORAL_TABLET | Freq: Four times a day (QID) | ORAL | Status: DC | PRN

## 2022-02-15 MED ORDER — ACETAMINOPHEN 500 MG PO TABS
1000.0000 mg | ORAL_TABLET | Freq: Four times a day (QID) | ORAL | Status: AC
  Administered 2022-02-16 (×3): 1000 mg via ORAL
  Filled 2022-02-15 (×3): qty 2

## 2022-02-15 MED ORDER — BUPIVACAINE-EPINEPHRINE (PF) 0.25% -1:200000 IJ SOLN
INTRAMUSCULAR | Status: AC
  Filled 2022-02-15: qty 30

## 2022-02-15 MED ORDER — CLONIDINE HCL (ANALGESIA) 100 MCG/ML EP SOLN
EPIDURAL | Status: AC
  Filled 2022-02-15: qty 10

## 2022-02-15 MED ORDER — VANCOMYCIN HCL IN DEXTROSE 1-5 GM/200ML-% IV SOLN
1000.0000 mg | INTRAVENOUS | Status: AC
  Administered 2022-02-15: 1000 mg via INTRAVENOUS
  Filled 2022-02-15: qty 200

## 2022-02-15 MED ORDER — BUPIVACAINE HCL (PF) 0.25 % IJ SOLN
INTRAMUSCULAR | Status: DC | PRN
  Administered 2022-02-15: 30 mL

## 2022-02-15 MED ORDER — 0.9 % SODIUM CHLORIDE (POUR BTL) OPTIME
TOPICAL | Status: DC | PRN
  Administered 2022-02-15: 1000 mL

## 2022-02-15 MED ORDER — PHENYLEPHRINE 80 MCG/ML (10ML) SYRINGE FOR IV PUSH (FOR BLOOD PRESSURE SUPPORT)
PREFILLED_SYRINGE | INTRAVENOUS | Status: DC | PRN
  Administered 2022-02-15 (×2): 80 ug via INTRAVENOUS

## 2022-02-15 MED ORDER — CLONIDINE HCL (ANALGESIA) 100 MCG/ML EP SOLN
EPIDURAL | Status: DC | PRN
  Administered 2022-02-15: 1 mL

## 2022-02-15 MED ORDER — QUETIAPINE FUMARATE 100 MG PO TABS
200.0000 mg | ORAL_TABLET | Freq: Every day | ORAL | Status: DC
  Administered 2022-02-15: 200 mg via ORAL
  Filled 2022-02-15: qty 2
  Filled 2022-02-15: qty 1

## 2022-02-15 MED ORDER — STERILE WATER FOR IRRIGATION IR SOLN
Status: DC | PRN
  Administered 2022-02-15: 1000 mL

## 2022-02-15 MED ORDER — ORAL CARE MOUTH RINSE
15.0000 mL | Freq: Once | OROMUCOSAL | Status: AC
Start: 2022-02-15 — End: 2022-02-15

## 2022-02-15 MED ORDER — SODIUM CHLORIDE 0.9 % IR SOLN
Status: DC | PRN
  Administered 2022-02-15: 1000 mL

## 2022-02-15 MED ORDER — IRRISEPT - 450ML BOTTLE WITH 0.05% CHG IN STERILE WATER, USP 99.95% OPTIME
TOPICAL | Status: DC | PRN
  Administered 2022-02-15: 450 mL via TOPICAL

## 2022-02-15 MED ORDER — TRANEXAMIC ACID-NACL 1000-0.7 MG/100ML-% IV SOLN
INTRAVENOUS | Status: AC
Start: 2022-02-15 — End: ?
  Filled 2022-02-15: qty 100

## 2022-02-15 MED ORDER — BUPIVACAINE IN DEXTROSE 0.75-8.25 % IT SOLN
INTRATHECAL | Status: DC | PRN
  Administered 2022-02-15: 2 mL via INTRATHECAL

## 2022-02-15 MED ORDER — ALBUMIN HUMAN 5 % IV SOLN
12.5000 g | Freq: Once | INTRAVENOUS | Status: AC
  Administered 2022-02-15: 12.5 g via INTRAVENOUS

## 2022-02-15 MED ORDER — MORPHINE SULFATE (PF) 4 MG/ML IV SOLN
INTRAVENOUS | Status: AC
  Filled 2022-02-15: qty 2

## 2022-02-15 MED ORDER — POVIDONE-IODINE 10 % EX SWAB: 2.0000 | Freq: Once | CUTANEOUS | Status: AC

## 2022-02-15 MED ORDER — METOCLOPRAMIDE HCL 5 MG/ML IJ SOLN: 5.0000 mg | Freq: Three times a day (TID) | INTRAMUSCULAR | Status: DC | PRN

## 2022-02-15 SURGICAL SUPPLY — 55 items
ACETAB CUP W/GRIPTION 54 (Plate) ×1 IMPLANT
BAG COUNTER SPONGE SURGICOUNT (BAG) ×1 IMPLANT
BAG DECANTER FOR FLEXI CONT (MISCELLANEOUS) ×1 IMPLANT
BLADE CLIPPER SURG (BLADE) ×1 IMPLANT
BLADE SAW SGTL 18X1.27X75 (BLADE) ×1 IMPLANT
CELLS DAT CNTRL 66122 CELL SVR (MISCELLANEOUS) ×1 IMPLANT
COVER PERINEAL POST (MISCELLANEOUS) ×1 IMPLANT
COVER SURGICAL LIGHT HANDLE (MISCELLANEOUS) ×1 IMPLANT
CUP ACETAB W/GRIPTION 54 (Plate) IMPLANT
DRAPE C-ARM 42X72 X-RAY (DRAPES) ×1 IMPLANT
DRAPE STERI IOBAN 125X83 (DRAPES) ×1 IMPLANT
DRAPE U-SHAPE 47X51 STRL (DRAPES) ×3 IMPLANT
DRSG AQUACEL AG ADV 3.5X10 (GAUZE/BANDAGES/DRESSINGS) ×1 IMPLANT
DURAPREP 26ML APPLICATOR (WOUND CARE) ×1 IMPLANT
ELECT BLADE 4.0 EZ CLEAN MEGAD (MISCELLANEOUS) ×1
ELECT REM PT RETURN 9FT ADLT (ELECTROSURGICAL) ×1
ELECTRODE BLDE 4.0 EZ CLN MEGD (MISCELLANEOUS) ×1 IMPLANT
ELECTRODE REM PT RTRN 9FT ADLT (ELECTROSURGICAL) ×1 IMPLANT
GLOVE BIOGEL PI IND STRL 8 (GLOVE) ×1 IMPLANT
GLOVE BIOGEL PI INDICATOR 8 (GLOVE) ×1
GLOVE ECLIPSE 8.0 STRL XLNG CF (GLOVE) ×2 IMPLANT
GOWN STRL REUS W/ TWL LRG LVL3 (GOWN DISPOSABLE) ×3 IMPLANT
GOWN STRL REUS W/TWL LRG LVL3 (GOWN DISPOSABLE) ×3
HANDPIECE INTERPULSE COAX TIP (DISPOSABLE) ×1
HEAD CERAMIC BIO DELTA 36 (Hips) IMPLANT
HOOD PEEL AWAY FLYTE STAYCOOL (MISCELLANEOUS) ×3 IMPLANT
JET LAVAGE IRRISEPT WOUND (IRRIGATION / IRRIGATOR) ×1
KIT BASIN OR (CUSTOM PROCEDURE TRAY) ×1 IMPLANT
KIT TURNOVER KIT B (KITS) ×1 IMPLANT
LAVAGE JET IRRISEPT WOUND (IRRIGATION / IRRIGATOR) ×1 IMPLANT
LINER NEUTRAL 54X36MM PLUS 4 (Hips) IMPLANT
NEEDLE HYPO 22GX1.5 SAFETY (NEEDLE) ×1 IMPLANT
NS IRRIG 1000ML POUR BTL (IV SOLUTION) ×1 IMPLANT
PACK TOTAL JOINT (CUSTOM PROCEDURE TRAY) ×1 IMPLANT
PAD ARMBOARD 7.5X6 YLW CONV (MISCELLANEOUS) ×1 IMPLANT
RETRACTOR WND ALEXIS 18 MED (MISCELLANEOUS) ×1 IMPLANT
RTRCTR WOUND ALEXIS 18CM MED (MISCELLANEOUS) ×1
SCREW 6.5MMX25MM (Screw) IMPLANT
SET HNDPC FAN SPRY TIP SCT (DISPOSABLE) ×1 IMPLANT
STEM FEMORAL SZ6 HIGH ACTIS (Stem) IMPLANT
STRIP CLOSURE SKIN 1/2X4 (GAUZE/BANDAGES/DRESSINGS) ×1 IMPLANT
SUT ETHIBOND NAB CT1 #1 30IN (SUTURE) ×2 IMPLANT
SUT MNCRL AB 3-0 PS2 18 (SUTURE) ×1 IMPLANT
SUT VIC AB 0 CT1 27 (SUTURE) ×3
SUT VIC AB 0 CT1 27XBRD ANBCTR (SUTURE) ×3 IMPLANT
SUT VIC AB 1 CT1 27 (SUTURE) ×4
SUT VIC AB 1 CT1 27XBRD ANBCTR (SUTURE) ×3 IMPLANT
SUT VIC AB 2-0 CT1 27 (SUTURE) ×2
SUT VIC AB 2-0 CT1 TAPERPNT 27 (SUTURE) ×2 IMPLANT
SYR 30ML LL (SYRINGE) ×1 IMPLANT
TOWEL GREEN STERILE (TOWEL DISPOSABLE) ×1 IMPLANT
TOWEL GREEN STERILE FF (TOWEL DISPOSABLE) ×1 IMPLANT
TRAY FOLEY MTR SLVR 16FR STAT (SET/KITS/TRAYS/PACK) ×1 IMPLANT
WATER STERILE IRR 1000ML POUR (IV SOLUTION) ×1 IMPLANT
YANKAUER SUCT BULB TIP NO VENT (SUCTIONS) IMPLANT

## 2022-02-15 NOTE — Brief Op Note (Signed)
   02/15/2022  4:30 PM  PATIENT:  Jon Ramos  56 y.o. male  PRE-OPERATIVE DIAGNOSIS:  left hip osteoarthritis  POST-OPERATIVE DIAGNOSIS:  left hip osteoarthritis  PROCEDURE:  Procedure(s): LEFT TOTAL HIP ARTHROPLASTY ANTERIOR APPROACH  SURGEON:  Surgeon(s): Cammy Copa, MD  ASSISTANT: Karenann Cai, PA  ANESTHESIA:   Spinal  EBL: 300 ml    Total I/O In: 1300 [I.V.:1200; IV Piggyback:100] Out: 900 [Urine:650; Blood:250]  BLOOD ADMINISTERED: none  DRAINS: none   LOCAL MEDICATIONS USED: Marcaine morphine clonidine Exparel vancomycin powder  SPECIMEN:  No Specimen  COUNTS:  YES  TOURNIQUET:  * No tourniquets in log *  DICTATION: .[id number] PLAN OF CARE: Admit for overnight observation  PATIENT DISPOSITION:  PACU - hemodynamically stable

## 2022-02-15 NOTE — Anesthesia Procedure Notes (Signed)
Spinal  Patient location during procedure: OR Reason for block: surgical anesthesia Staffing Performed: anesthesiologist  Anesthesiologist: Piotr Christopher E, MD Performed by: Shaylene Paganelli E, MD Authorized by: Zully Frane E, MD   Preanesthetic Checklist Completed: patient identified, IV checked, risks and benefits discussed, surgical consent, monitors and equipment checked, pre-op evaluation and timeout performed Spinal Block Patient position: sitting Prep: DuraPrep and site prepped and draped Patient monitoring: continuous pulse ox, blood pressure and heart rate Approach: midline Location: L3-4 Injection technique: single-shot Needle Needle type: Pencan  Needle gauge: 24 G Needle length: 10 cm Assessment Events: CSF return Additional Notes Functioning IV was confirmed and monitors were applied. Sterile prep and drape, including hand hygiene and sterile gloves were used. The patient was positioned and the spine was prepped. The skin was anesthetized with lidocaine.  Free flow of clear CSF was obtained prior to injecting local anesthetic into the CSF. The needle was carefully withdrawn. The patient tolerated the procedure well.      

## 2022-02-15 NOTE — Anesthesia Procedure Notes (Signed)
Procedure Name: MAC Date/Time: 02/15/2022 1:56 PM  Performed by: Dorthea Cove, CRNAPre-anesthesia Checklist: Patient identified, Emergency Drugs available, Suction available, Patient being monitored and Timeout performed Patient Re-evaluated:Patient Re-evaluated prior to induction Oxygen Delivery Method: Simple face mask Preoxygenation: Pre-oxygenation with 100% oxygen Induction Type: IV induction Placement Confirmation: positive ETCO2 and CO2 detector Dental Injury: Teeth and Oropharynx as per pre-operative assessment

## 2022-02-15 NOTE — H&P (Signed)
TOTAL HIP ADMISSION H&P  Patient is admitted for left total hip arthroplasty.  Subjective:  Chief Complaint: left hip pain  HPI: Jon Ramos, 56 y.o. male, has a history of pain and functional disability in the left hip(s) due to arthritis and patient has failed non-surgical conservative treatments for greater than 12 weeks to include NSAID's and/or analgesics, flexibility and strengthening excercises, use of assistive devices, and activity modification.  Onset of symptoms was abrupt starting 3 years ago with rapidlly worsening course since that time.The patient noted no past surgery on the left hip(s).  Patient currently rates pain in the left hip at 9 out of 10 with activity. Patient has night pain, worsening of pain with activity and weight bearing, trendelenberg gait, pain that interfers with activities of daily living, pain with passive range of motion, crepitus, and joint swelling. Patient has evidence of subchondral cysts, subchondral sclerosis, and joint space narrowing by imaging studies. This condition presents safety issues increasing the risk of falls. This patient has had  significant pain over the past year with pain that interferes with all activities of daily living.  His walking endurance is severely limited.  No personal or family history of DVT or pulmonary embolism .  There is no current active infection.  Patient Active Problem List   Diagnosis Date Noted   Seizures (HCC) 01/01/2020   Alcohol abuse 01/01/2020   Bipolar 1 disorder (HCC) 12/31/2019   Syncope and collapse 12/25/2019   Bipolar 1 disorder with moderate mania (HCC) 12/25/2019   Past Medical History:  Diagnosis Date   Bipolar 1 disorder (HCC)    Hypertension     Past Surgical History:  Procedure Laterality Date   TYMPANOSTOMY TUBE PLACEMENT     as a child    Current Facility-Administered Medications  Medication Dose Route Frequency Provider Last Rate Last Admin   lactated ringers infusion   Intravenous  Continuous Ellender, Catheryn Bacon, MD 10 mL/hr at 02/15/22 1148 New Bag at 02/15/22 1148   povidone-iodine (BETADINE) 7.5 % scrub   Topical Once Magnant, Charles L, PA-C       vancomycin (VANCOCIN) IVPB 1000 mg/200 mL premix  1,000 mg Intravenous On Call to OR Magnant, Charles L, PA-C 200 mL/hr at 02/15/22 1145 1,000 mg at 02/15/22 1145   Allergies  Allergen Reactions   Penicillins Hives   Sulfa Antibiotics Hives    Social History   Tobacco Use   Smoking status: Never   Smokeless tobacco: Never  Substance Use Topics   Alcohol use: No    History reviewed. No pertinent family history.   Review of Systems  Musculoskeletal:  Positive for arthralgias.  All other systems reviewed and are negative.   Objective:  Physical Exam Vitals reviewed.  HENT:     Head: Normocephalic.     Nose: Nose normal.     Mouth/Throat:     Mouth: Mucous membranes are moist.  Eyes:     Pupils: Pupils are equal, round, and reactive to light.  Cardiovascular:     Rate and Rhythm: Normal rate.     Pulses: Normal pulses.  Pulmonary:     Effort: Pulmonary effort is normal.  Abdominal:     General: Abdomen is flat.  Musculoskeletal:     Cervical back: Normal range of motion.  Skin:    General: Skin is warm.     Capillary Refill: Capillary refill takes less than 2 seconds.  Neurological:     General: No focal deficit present.  Mental Status: He is alert.  Psychiatric:        Mood and Affect: Mood normal.     Vital signs in last 24 hours: Temp:  [97.8 F (36.6 C)] 97.8 F (36.6 C) (08/17 1020) Pulse Rate:  [80] 80 (08/17 1020) Resp:  [18] 18 (08/17 1020) BP: (132)/(94) 132/94 (08/17 1020) SpO2:  [100 %] 100 % (08/17 1020) Weight:  [99.3 kg] 99.3 kg (08/17 1020)  Labs:   Estimated body mass index is 30.54 kg/m as calculated from the following:   Height as of this encounter: 5\' 11"  (1.803 m).   Weight as of this encounter: 99.3 kg.   Imaging Review Plain radiographs demonstrate severe  degenerative joint disease of the left hip(s). The bone quality appears to be good for age and reported activity level.      Assessment/Plan:  End stage arthritis, left hip(s)  The patient history, physical examination, clinical judgement of the provider and imaging studies are consistent with end stage degenerative joint disease of the left hip(s) and total hip arthroplasty is deemed medically necessary. The treatment options including medical management, injection therapy, arthroscopy and arthroplasty were discussed at length. The risks and benefits of total hip arthroplasty were presented and reviewed. The risks due to aseptic loosening, infection, stiffness, dislocation/subluxation,  thromboembolic complications and other imponderables were discussed.  The patient acknowledged the explanation, agreed to proceed with the plan and consent was signed. Patient is being admitted for inpatient treatment for surgery, pain control, PT, OT, prophylactic antibiotics, VTE prophylaxis, progressive ambulation and ADL's and discharge planning.The patient is planning to be discharged home with home health services    Patient's anticipated LOS is less than 2 midnights, meeting these requirements: - Younger than 77 - Lives within 1 hour of care - Has a competent adult at home to recover with post-op recover - NO history of  - Chronic pain requiring opiods  - Diabetes  - Coronary Artery Disease  - Heart failure  - Heart attack  - Stroke  - DVT/VTE  - Cardiac arrhythmia  - Respiratory Failure/COPD  - Renal failure  - Anemia  - Advanced Liver disease

## 2022-02-16 ENCOUNTER — Encounter (HOSPITAL_COMMUNITY): Payer: Self-pay | Admitting: Orthopedic Surgery

## 2022-02-16 DIAGNOSIS — M1612 Unilateral primary osteoarthritis, left hip: Secondary | ICD-10-CM | POA: Diagnosis not present

## 2022-02-16 LAB — CBC
HCT: 29 % — ABNORMAL LOW (ref 39.0–52.0)
Hemoglobin: 9.9 g/dL — ABNORMAL LOW (ref 13.0–17.0)
MCH: 33 pg (ref 26.0–34.0)
MCHC: 34.1 g/dL (ref 30.0–36.0)
MCV: 96.7 fL (ref 80.0–100.0)
Platelets: 113 10*3/uL — ABNORMAL LOW (ref 150–400)
RBC: 3 MIL/uL — ABNORMAL LOW (ref 4.22–5.81)
RDW: 13.1 % (ref 11.5–15.5)
WBC: 4.7 10*3/uL (ref 4.0–10.5)
nRBC: 0 % (ref 0.0–0.2)

## 2022-02-16 MED ORDER — ASPIRIN 81 MG PO CHEW: 81.0000 mg | CHEWABLE_TABLET | Freq: Two times a day (BID) | ORAL | 0 refills | Status: DC

## 2022-02-16 MED ORDER — NAPROXEN 250 MG PO TABS: 250.0000 mg | ORAL_TABLET | Freq: Two times a day (BID) | ORAL | 0 refills | Status: DC

## 2022-02-16 MED ORDER — METHOCARBAMOL 500 MG PO TABS: 500.0000 mg | ORAL_TABLET | Freq: Three times a day (TID) | ORAL | 0 refills | Status: DC | PRN

## 2022-02-16 MED ORDER — OXYCODONE HCL 5 MG PO TABS: 5.0000 mg | ORAL_TABLET | ORAL | 0 refills | Status: DC | PRN

## 2022-02-16 MED ORDER — ACETAMINOPHEN 325 MG PO TABS: 650.0000 mg | ORAL_TABLET | Freq: Four times a day (QID) | ORAL | 0 refills | Status: DC | PRN

## 2022-02-16 NOTE — Op Note (Signed)
Jon Ramos, DOUGE MEDICAL RECORD NO: [id number] ACCOUNT NO: [id number] DATE OF BIRTH: [birth year] FACILITY: [facility] LOCATION: [facility] PHYSICIAN: Graylin Shiver. August Saucer, MD  Operative Report   DATE OF PROCEDURE: 02/15/2022  PREOPERATIVE DIAGNOSIS:  Left hip arthritis.  POSTOPERATIVE DIAGNOSIS:  Left hip arthritis.  PROCEDURE:  Left total hip replacement utilizing DePuy components, ACTIS size 6 stem, high offset with +12 ball, 54 mm press-fit Pinnacle cup with +4 liner.  SURGEON:  Attending, Cammy Copa, MD  ASSISTANT:  Karenann Cai, PA  INDICATIONS:  The patient is a 56 year old patient with left hip pain and end-stage arthritis, who presents for operative management after explanation of risks and benefits.  DESCRIPTION OF PROCEDURE:  The patient was brought to the operating room where spinal anesthetic was induced.  Preoperative antibiotics administered.  Timeout was called.  The patient was placed on the Hana bed symmetrically with the hips aligned.  The  left hip region was pre-scrubbed with alcohol and Betadine, allowed to air dry, prepped with DuraPrep solution and draped in sterile manner.  Ioban used to cover the operative field with the wall drape.  Timeout was called.  Incision was made 2 cm distal  and posterior to the anterior superior iliac crest.  A 10 cm incision was made.  Skin and subcutaneous tissue were sharply divided.  Fascia lata was encountered.  Branches of the lateral femoral cutaneous nerve were protected when possible.  The fascia  over the tensor fascia lata was incised from the anterior superior iliac crest distally. This was lifted up and the plane between the tensor fascia lata and the rectus was developed.  The tensor fascia lata was large.  Circumflex vessels were coagulated.   Retractors were then placed on the posterior and anterior aspect of the femoral neck.  Trochanteric ridge was visualized.  Capsulotomy was made and marked with #1 Ethibond suture and a  T-flap was developed.  The leg was progressively externally rotated  in order to facilitate capsular release down to the lesser trochanter.  At this time, the smooth acetabular retractors were placed on the femoral neck and the femoral neck cut was made with fluoroscopy as a check.  Neck cut was made.  Head was removed.   Next, the patient was placed under some traction in about 30-40 degrees of external rotation.  Reaming was performed under fluoroscopic guidance in 45 degrees of abduction and about 15 of anteversion.  Reaming was performed up to 53 mm and a 54 cup was  press-fit, tapped into position with excellent press fit obtained.  Next, +4 liner cup was placed.  Excellent fit was obtained on the cup.  At this time, attention was directed towards the femur.  Femoral lift was placed.  Leg was externally rotated to  about 120 degrees and with traction off, it was adducted, and extended.  Trochanteric retractor was placed and Mueller retractor was placed.  Broaching was performed up to a size 6.  Lateralization was performed with the box cutter.  Very good fit on the  stem was obtained and confirmed under fluoroscopy.  The true stem was placed.  Reduction was then performed with the +8 and +12 head balls.  The +12 gave good restoration of offset as well as good leg length approximation.  This one was chosen.  The  patient was stable with external rotation to 45 degrees and extension to 60, very stable with internal and external rotation as well as flexion to 90.  Thorough irrigation was performed.  It should be noted that IrriSept solution was used after the  incision, after the arthrotomy and multiple times during the case.  It was also used to irrigate the femoral canal and vancomycin powder was placed into the femoral canal.  Following 3 liters of irrigating solution pulsatile, the capsule was closed.   This was done with #1 Vicryl suture.  IrriSept solution and vancomycin powder was placed onto the  prosthesis prior to closure.  Further irrigation was performed with pouring irrigation followed by IrriSept solution and then vancomycin powder.  Solution  of Marcaine, Exparel and saline was injected into the tissue around the hip.  Skin edges were anesthetized using a combination of Marcaine, morphine, clonidine.  The fascia lata and dead space were closed using #1 Vicryl suture.  Skin closure was  obtained using 0 Vicryl suture, 2-0 Vicryl suture, and 3-0 Monocryl with Steri-Strips and Aquacel dressing applied.  Leg lengths equal at the conclusion of the case.  The patient tolerated the procedure well without immediate complication.  Jon Ramos's  assistance was required at all times for retraction, opening, closing, mobilization of tissue.  His assistance was a medical necessity.   SHW D: 02/15/2022 4:37:13 pm T: 02/16/2022 12:56:00 am  JOB: [id number]/ [id number]

## 2022-02-16 NOTE — Progress Notes (Signed)
Pt discharged home with spouse in stable condition. Discharge instructions given and equipment delivered to the room. Pt verbalized understanding. No immediate questions or concerns. Scripts sent to pharmacy of choice. Discharged from unit via wheelchair.

## 2022-02-16 NOTE — Progress Notes (Signed)
Paged Dr. Lajoyce Corners to notify him that patient cannot void and his bladder scan: .

## 2022-02-16 NOTE — Anesthesia Postprocedure Evaluation (Signed)
Anesthesia Post Note  Patient: Jon Ramos  Procedure(s) Performed: LEFT TOTAL HIP ARTHROPLASTY ANTERIOR APPROACH (Left: Hip)     Patient location during evaluation: PACU Anesthesia Type: Spinal and MAC Level of consciousness: awake and alert Pain management: pain level controlled Vital Signs Assessment: post-procedure vital signs reviewed and stable Respiratory status: spontaneous breathing, nonlabored ventilation, respiratory function stable and patient connected to nasal cannula oxygen Cardiovascular status: stable and blood pressure returned to baseline Postop Assessment: no apparent nausea or vomiting Anesthetic complications: no   No notable events documented.  Last Vitals:  Vitals:   02/16/22 0431 02/16/22 0847  BP: 108/66 121/76  Pulse: 73   Resp: 15   Temp: 36.7 C 36.7 C  SpO2: 100%     Last Pain:  Vitals:   02/16/22 0847  TempSrc: Oral  PainSc:                  March Rummage Elisha Mcgruder

## 2022-02-16 NOTE — Progress Notes (Signed)
  Subjective: Jon Ramos is a 56 y.o. male s/p left THA.  They are POD 1.  Pt's pain is controlled.  Pt has ambulated with some difficulty.   Was able to ambulate to the bathroom.  Did have some urinary retention overnight that required catheterization.  Has not urinated without the use of the catheter since procedure.  Denies any abdominal pain.  No chest pain or shortness of breath.  No fevers or consistent chills.  Does not feel dizzy or lightheaded.  Hemoglobin dropped to 9.9 since procedure.  Objective: Vital signs in last 24 hours: Temp:  [97.8 F (36.6 C)-98.6 F (37 C)] 98.1 F (36.7 C) (08/18 0431) Pulse Rate:  [58-80] 73 (08/18 0431) Resp:  [11-18] 15 (08/18 0431) BP: (96-132)/(58-94) 108/66 (08/18 0431) SpO2:  [95 %-100 %] 100 % (08/18 0431) Weight:  [99.3 kg] 99.3 kg (08/17 1020)  Intake/Output from previous day: 08/17 0701 - 08/18 0700 In: 1300 [I.V.:1200; IV Piggyback:100] Out: 975 [Urine:725; Blood:250] Intake/Output this shift: No intake/output data recorded.  Exam:  No gross blood or drainage overlying the dressing 2+ DP pulse Sensation intact distally in the left foot Able to dorsiflex and plantarflex the left foot Leg lengths are equal.  No calf tenderness.  Negative Homans' sign.   Labs: Recent Labs    02/16/22 0211  HGB 9.9*   Recent Labs    02/16/22 0211  WBC 4.7  RBC 3.00*  HCT 29.0*  PLT 113*   No results for input(s): "NA", "K", "CL", "CO2", "BUN", "CREATININE", "GLUCOSE", "CALCIUM" in the last 72 hours. No results for input(s): "LABPT", "INR" in the last 72 hours.  Assessment/Plan: Pt is POD 1 s/p left THA.    -Plan to discharge to home today or tomorrow pending patient's pain and PT eval  -WBAT with a walker    Jon Ramos Jon Ramos 02/16/2022, 8:19 AM

## 2022-02-16 NOTE — Evaluation (Signed)
Physical Therapy Evaluation Patient Details Name: Jon Ramos MRN: [id number] DOB: [birth year] Today's Date: 02/16/2022  History of Present Illness  Jon Ramos is a 56 y.o. male s/p left THA.  Clinical Impression  Pt admitted with above diagnosis. Pt was able to ambulate with RW with good safety. All education completed with pt and pt really desires to go home today. Will follow acutely while pt is in hospital.  Pt currently with functional limitations due to the deficits listed below (see PT Problem List). Pt will benefit from skilled PT to increase their independence and safety with mobility to allow discharge to the venue listed below.          Recommendations for follow up therapy are one component of a multi-disciplinary discharge planning process, led by the attending physician.  Recommendations may be updated based on patient status, additional functional criteria and insurance authorization.  Follow Up Recommendations Follow physician's recommendations for discharge plan and follow up therapies (Outpt PT recommended for left hip weakness)      Assistance Recommended at Discharge PRN  Patient can return home with the following  A little help with walking and/or transfers;Help with stairs or ramp for entrance;Assist for transportation;Assistance with cooking/housework    Equipment Recommendations Rolling walker (2 wheels);BSC/3in1  Recommendations for Other Services       Functional Status Assessment Patient has had a recent decline in their functional status and demonstrates the ability to make significant improvements in function in a reasonable and predictable amount of time.     Precautions / Restrictions Precautions Precautions: None Restrictions Weight Bearing Restrictions: Yes LLE Weight Bearing: Weight bearing as tolerated      Mobility  Bed Mobility Overal bed mobility: Needs Assistance Bed Mobility: Supine to Sit     Supine to sit: Supervision, Min guard      General bed mobility comments: a little assist to get LEs started    Transfers Overall transfer level: Needs assistance Equipment used: Rolling walker (2 wheels) Transfers: Sit to/from Stand Sit to Stand: Min guard           General transfer comment: cues for hand placement    Ambulation/Gait Ambulation/Gait assistance: Supervision, Min guard Gait Distance (Feet): 280 Feet Assistive device: Rolling walker (2 wheels) Gait Pattern/deviations: Step-to pattern, Decreased weight shift to left, Decreased stance time - left, Antalgic   Gait velocity interpretation: 1.31 - 2.62 ft/sec, indicative of limited community ambulator   General Gait Details: Pt intiially needed cues for sequencing steps and RW however did better the more he walked.  Overall good safety.  Stairs Stairs: Yes Stairs assistance: Min guard Stair Management: One rail Left, Sideways, Backwards, With walker, Step to pattern Number of Stairs: 5 General stair comments: Pt able to demonstrate going up and down steps sideways with left rail. Discussed and showed pt how to go up and down steps with RW backwards as wll.  Wheelchair Mobility    Modified Rankin (Stroke Patients Only)       Balance Overall balance assessment: Needs assistance Sitting-balance support: No upper extremity supported, Feet supported Sitting balance-Leahy Scale: Fair     Standing balance support: Bilateral upper extremity supported, During functional activity, Reliant on assistive device for balance Standing balance-Leahy Scale: Fair Standing balance comment: can stand statically without RW                             Pertinent Vitals/Pain Pain Assessment Pain Assessment: No/denies  pain    Home Living Family/patient expects to be discharged to:: Private residence Living Arrangements: Spouse/significant other Available Help at Discharge: Family;Available 24 hours/day (works from home) Type of Home: House Home  Access: Stairs to enter Entrance Stairs-Rails: Doctor, general practice of Steps: 5   Home Layout: Able to live on main level with bedroom/bathroom;Full bath on main level;Two level Home Equipment: Cane - single point Additional Comments: computer work per pt and went in to office    Prior Function Prior Level of Function : Independent/Modified Independent;Driving;Working/employed                     Hand Dominance   Dominant Hand: Right    Extremity/Trunk Assessment   Upper Extremity Assessment Upper Extremity Assessment: Defer to OT evaluation    Lower Extremity Assessment Lower Extremity Assessment: LLE deficits/detail LLE Deficits / Details: grossly 3/5    Cervical / Trunk Assessment Cervical / Trunk Assessment: Normal  Communication   Communication: No difficulties  Cognition Arousal/Alertness: Awake/alert Behavior During Therapy: WFL for tasks assessed/performed Overall Cognitive Status: Within Functional Limits for tasks assessed                                          General Comments General comments (skin integrity, edema, etc.): VSS    Exercises Total Joint Exercises Ankle Circles/Pumps: AROM, Both, 15 reps, Supine Quad Sets: AROM, Both, 15 reps, Supine Short Arc Quad: AROM, Both, 10 reps, Supine Heel Slides: AAROM, Left, 10 reps, Supine Hip ABduction/ADduction: AAROM, Left, 10 reps, Supine, Standing Long Arc Quad: AROM, Left, 10 reps, Seated Marching in Standing: AROM, Left, 5 reps, Standing Standing Hip Extension: AROM, Left, 5 reps, Standing   Assessment/Plan    PT Assessment Patient needs continued PT services  PT Problem List Decreased strength;Decreased range of motion;Decreased activity tolerance;Decreased balance;Decreased mobility;Decreased knowledge of use of DME;Decreased safety awareness;Decreased knowledge of precautions;Pain       PT Treatment Interventions DME instruction;Gait training;Stair  training;Functional mobility training;Therapeutic activities;Therapeutic exercise;Balance training;Patient/family education    PT Goals (Current goals can be found in the Care Plan section)  Acute Rehab PT Goals Patient Stated Goal: to go home today PT Goal Formulation: With patient Time For Goal Achievement: 02/23/22 Potential to Achieve Goals: Good    Frequency 7X/week     Co-evaluation               AM-PAC PT "6 Clicks" Mobility  Outcome Measure Help needed turning from your back to your side while in a flat bed without using bedrails?: None Help needed moving from lying on your back to sitting on the side of a flat bed without using bedrails?: A Little Help needed moving to and from a bed to a chair (including a wheelchair)?: A Little Help needed standing up from a chair using your arms (e.g., wheelchair or bedside chair)?: A Little Help needed to walk in hospital room?: A Little Help needed climbing 3-5 steps with a railing? : A Little 6 Click Score: 19    End of Session Equipment Utilized During Treatment: Gait belt Activity Tolerance: Patient tolerated treatment well Patient left: in chair;with call bell/phone within reach;with chair alarm set Nurse Communication: Mobility status PT Visit Diagnosis: Muscle weakness (generalized) (M62.81);Pain Pain - Right/Left: Left Pain - part of body: Hip    Time: 5732-2025 PT Time Calculation (min) (ACUTE ONLY): 42 min  Charges:   PT Evaluation $PT Eval Moderate Complexity: 1 Mod PT Treatments $Gait Training: 8-22 mins $Therapeutic Exercise: 8-22 mins        [facility] M,PT Acute Rehab Services [phone]   Alvira Philips 02/16/2022, 2:55 PM

## 2022-02-16 NOTE — TOC Transition Note (Signed)
Transition of Care [facility] - CM/SW Discharge Note   Patient Details  Name: Jon Ramos MRN: [id number] Date of Birth: 04/15/1966  Transition of Care [facility]) CM/SW Contact:  Epifanio Lesches, RN Phone Number: 02/16/2022, 4:33 PM   Clinical Narrative:    Patient will DC to: home Anticipated DC date: 02/16/2022 Family notified: yes Transport by: car           s/p left THA, 8/17 Per MD patient ready for DC today . RN, patient, and patient's  wife  aware of DC. Referral made with [employer] for RW, and 3 in1/BSC. Equipment will be delivered to bedside prior to discharge. Provider to f/u with outpatient PT referral for pt. Post hospital f/u noted on AVS.  Wife to provide transportation to home.  RNCM will sign off for now as intervention is no longer needed. Please consult [place] again if new needs arise.   Final next level of care: Home/Self Care Barriers to Discharge: No Barriers Identified   Patient Goals and CMS Choice     Choice offered to / list presented to : Patient  Discharge Placement                       Discharge Plan and Services                DME Arranged: 3-N-1, Walker rolling DME Agency: [employer] Date DME Agency Contacted: 02/16/22 Time DME Agency Contacted: [phone] Representative spoke with at DME Agency: Arnold Long            Social Determinants of Health (SDOH) Interventions     Readmission Risk Interventions     No data to display

## 2022-02-16 NOTE — Transfer of Care (Signed)
Immediate Anesthesia Transfer of Care Note  Patient: Jon Ramos  Procedure(s) Performed: LEFT TOTAL HIP ARTHROPLASTY ANTERIOR APPROACH (Left: Hip)  Patient Location: PACU  Anesthesia Type:Spinal  Level of Consciousness: awake  Airway & Oxygen Therapy: Patient Spontanous Breathing  Post-op Assessment: Report given to RN  Post vital signs: Reviewed  Last Vitals:  Vitals Value Taken Time  BP 121/76 02/16/22 0847  Temp 36.7 C 02/16/22 0847  Pulse 71 02/16/22 1020  Resp 13 02/16/22 1020  SpO2 96 % 02/16/22 1020  Vitals shown include unvalidated device data.  Last Pain:  Vitals:   02/16/22 0847  TempSrc: Oral  PainSc:          Complications: No notable events documented.

## 2022-03-01 ENCOUNTER — Ambulatory Visit (INDEPENDENT_AMBULATORY_CARE_PROVIDER_SITE_OTHER): Payer: 59 | Admitting: Orthopedic Surgery

## 2022-03-01 ENCOUNTER — Encounter: Payer: Self-pay | Admitting: Orthopedic Surgery

## 2022-03-01 ENCOUNTER — Ambulatory Visit (INDEPENDENT_AMBULATORY_CARE_PROVIDER_SITE_OTHER): Payer: 59

## 2022-03-01 DIAGNOSIS — Z96642 Presence of left artificial hip joint: Secondary | ICD-10-CM

## 2022-03-01 NOTE — Progress Notes (Signed)
   Post-Op Visit Note   Patient: Jon Ramos           Date of Birth: 31-May-1966           MRN: [id number] Visit Date: 03/01/2022 PCP: Patient, No Pcp Per   Assessment & Plan:  Chief Complaint:  Chief Complaint  Patient presents with   Left Hip - Routine Post Op   Visit Diagnoses:  1. History of total left hip arthroplasty     Plan: Nihaal is a 56 year old patient is 2 weeks out from left total hip replacement.  He has been doing well.  Did not do home health physical therapy.  Taking Tylenol for pain.  Aspirin for DVT prophylaxis.  On examination he has equal leg lengths.  Normal gait.  Using no assistive devices.  Has 5- out of 5 hip flexion strength on the left compared to 5+ out of 5 on the right.  Abduction adduction strength is symmetric.  Incision intact.  X-rays look good.  Plan at this time is physical therapy 1-2 times a week for 1 to 4 weeks with transition to home exercise program.  Cautioned him against doing too much physical activity until soft tissue healing has occurred over the next 4 weeks.  Come back in 4 weeks for final clinical recheck.  Overall he is doing very well in terms of his mobility and pain.  Cautioned him against doing inversion table.  Follow-Up Instructions: Return in about 4 weeks (around 03/29/2022).   Orders:  Orders Placed This Encounter  Procedures   XR HIP UNILAT W OR W/O PELVIS 2-3 VIEWS LEFT   Ambulatory referral to Physical Therapy   No orders of the defined types were placed in this encounter.   Imaging: No results found.  PMFS History: Patient Active Problem List   Diagnosis Date Noted   S/P hip replacement, 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   Seizures (HCC) 01/01/2020   Alcohol abuse 01/01/2020   Bipolar 1 disorder (HCC) 12/31/2019   Syncope and collapse 12/25/2019   Bipolar 1 disorder with moderate mania (HCC) 12/25/2019   Past Medical History:  Diagnosis Date   Bipolar 1 disorder (HCC)    Hypertension     No family history on file.   Past Surgical History:  Procedure Laterality Date   TOTAL HIP ARTHROPLASTY Left 02/15/2022   Procedure: LEFT TOTAL HIP ARTHROPLASTY ANTERIOR APPROACH;  Surgeon: Cammy Copa, MD;  Location: [facility] OR;  Service: Orthopedics;  Laterality: Left;   TYMPANOSTOMY TUBE PLACEMENT     as a child   Social History   Occupational History   Not on file  Tobacco Use   Smoking status: Never   Smokeless tobacco: Never  Vaping Use   Vaping Use: Never used  Substance and Sexual Activity   Alcohol use: No   Drug use: No   Sexual activity: Not on file

## 2022-03-01 NOTE — Discharge Summary (Signed)
Physician Discharge Summary      Patient ID: Jon Ramos MRN: [id number] DOB/AGE: Mar 26, 1966 56 y.o.  Admit date: 02/15/2022 Discharge date: 02/16/2022  Admission Diagnoses:  Principal Problem:   S/P hip replacement, left   Discharge Diagnoses:  Same  Surgeries: Procedure(s): LEFT TOTAL HIP ARTHROPLASTY ANTERIOR APPROACH on 02/15/2022   Consultants:   Discharged Condition: Stable  Hospital Course: Jon Ramos is an 56 y.o. male who was admitted 02/15/2022 with a chief complaint of left hip pain, and found to have a diagnosis of left hip osteoarthritis.  They were brought to the operating room on 02/15/2022 and underwent the above named procedures.  Pt awoke from anesthesia without complication and was transferred to the floor. On POD1, patient's pain was well controlled and he was able to mobilize well with physical therapy.  No red flag symptoms.  He was discharged home on POD 1..  Pt will f/u with Dr. Marlou Sa in clinic in ~2 weeks.   Antibiotics given:  Anti-infectives (From admission, onward)    Start     Dose/Rate Route Frequency Ordered Stop   02/15/22 2000  ceFAZolin (ANCEF) IVPB 2g/100 mL premix        2 g 200 mL/hr over 30 Minutes Intravenous Every 8 hours 02/15/22 1921 02/16/22 0504   02/15/22 1502  vancomycin (VANCOCIN) powder  Status:  Discontinued          As needed 02/15/22 1503 02/15/22 1619   02/15/22 1100  vancomycin (VANCOCIN) IVPB 1000 mg/200 mL premix        1,000 mg 200 mL/hr over 60 Minutes Intravenous On call to O.R. 02/15/22 1033 02/15/22 1245     .  Recent vital signs:  Vitals:   02/16/22 0847 02/16/22 1152  BP: 121/76 122/78  Pulse:  89  Resp:  14  Temp: 98.1 F (36.7 C) 98.1 F (36.7 C)  SpO2:  100%    Recent laboratory studies:  Results for orders placed or performed during the hospital encounter of 02/15/22  CBC  Result Value Ref Range   WBC 4.7 4.0 - 10.5 K/uL   RBC 3.00 (L) 4.22 - 5.81 MIL/uL   Hemoglobin 9.9 (L) 13.0 - 17.0 g/dL    HCT 29.0 (L) 39.0 - 52.0 %   MCV 96.7 80.0 - 100.0 fL   MCH 33.0 26.0 - 34.0 pg   MCHC 34.1 30.0 - 36.0 g/dL   RDW 13.1 11.5 - 15.5 %   Platelets 113 (L) 150 - 400 K/uL   nRBC 0.0 0.0 - 0.2 %  ABO/Rh  Result Value Ref Range   ABO/RH(D)      A POS Performed at [facility] [address] [place], [place] [address]     Discharge Medications:   Allergies as of 02/16/2022       Reactions   Penicillins Hives   Sulfa Antibiotics Hives        Medication List     STOP taking these medications    amLODipine 5 MG tablet Commonly known as: NORVASC       TAKE these medications    acetaminophen 325 MG tablet Commonly known as: TYLENOL Take 2 tablets (650 mg total) by mouth every 6 (six) hours as needed for mild pain (pain score 1-3 or temp > 100.5).   aspirin 81 MG chewable tablet Chew 1 tablet (81 mg total) by mouth 2 (two) times daily.   divalproex 500 MG 24 hr tablet Commonly known as: DEPAKOTE ER Take 1,000  mg by mouth at bedtime. What changed: Another medication with the same name was changed. Make sure you understand how and when to take each.   divalproex 250 MG 24 hr tablet Commonly known as: DEPAKOTE ER Take 5 tablets (1,250 mg total) by mouth at bedtime. What changed: how much to take   lithium carbonate 450 MG CR tablet Commonly known as: ESKALITH Take 450 mg by mouth at bedtime.   methocarbamol 500 MG tablet Commonly known as: ROBAXIN Take 1 tablet (500 mg total) by mouth every 8 (eight) hours as needed for muscle spasms.   naproxen 250 MG tablet Commonly known as: NAPROSYN Take 1 tablet (250 mg total) by mouth 2 (two) times daily with a meal.   oxyCODONE 5 MG immediate release tablet Commonly known as: Oxy IR/ROXICODONE Take 1 tablet (5 mg total) by mouth every 4 (four) hours as needed for moderate pain (pain score 4-6).   QUEtiapine 300 MG tablet Commonly known as: SEROQUEL Take 1 tablet (300 mg total) by mouth at bedtime.    QUEtiapine 200 MG tablet Commonly known as: SEROQUEL Take 200 mg by mouth at bedtime.   zinc gluconate 50 MG tablet Take 50 mg by mouth daily.        Diagnostic Studies: Pelvis Portable  Result Date: 02/15/2022 CLINICAL DATA:  Hip replacement after fall EXAM: PORTABLE PELVIS 1-2 VIEWS COMPARISON:  Left hip x-ray 01/08/2022 FINDINGS: There is a new left hip arthroplasty present in anatomic alignment. There is lateral soft tissue swelling and air compatible with recent surgery. Joint spaces are well maintained. No acute fracture. IMPRESSION: New left hip arthroplasty in anatomic alignment. Electronically Signed   By: Ronney Asters M.D.   On: 02/15/2022 16:57   DG HIP UNILAT WITH PELVIS 1V LEFT  Result Date: 02/15/2022 CLINICAL DATA:  Left total hip arthroplasty. EXAM: DG HIP (WITH OR WITHOUT PELVIS) 1V*L* COMPARISON:  None Available. FINDINGS: Four C-arm images of the left hip and inferior pelvis demonstrate a left hip prosthesis in satisfactory position and alignment. No fracture or dislocation visualized. IMPRESSION: Satisfactory appearance of a left hip prosthesis. Electronically Signed   By: Claudie Revering M.D.   On: 02/15/2022 15:27   DG C-Arm 1-60 Min-No Report  Result Date: 02/15/2022 Fluoroscopy was utilized by the requesting physician.  No radiographic interpretation.   DG C-Arm 1-60 Min-No Report  Result Date: 02/15/2022 Fluoroscopy was utilized by the requesting physician.  No radiographic interpretation.    Disposition: Discharge disposition: 01-Home or Self Care       Discharge Instructions     Call MD / Call 911   Complete by: As directed    If you experience chest pain or shortness of breath, CALL 911 and be transported to the hospital emergency room.  If you develope a fever above 101 F, pus (white drainage) or increased drainage or redness at the wound, or calf pain, call your surgeon's office.   Constipation Prevention   Complete by: As directed    Drink  plenty of fluids.  Prune juice may be helpful.  You may use a stool softener, such as Colace (over the counter) 100 mg twice a day.  Use MiraLax (over the counter) for constipation as needed.   Diet - low sodium heart healthy   Complete by: As directed    Discharge instructions   Complete by: As directed    You may shower, dressing is waterproof.  Do not remove the dressing, we will remove it at your  first post-op appointment.  Do not take a bath or soak the hip in a tub or pool.  You may weightbear as you can tolerate on the operative leg with a walker.  Continue with exercises that you were taught by PT in the hospital.  You will follow-up with Dr. Marlou Sa in the clinic in 2 weeks at your given appointment date.    INSTRUCTIONS AFTER JOINT REPLACEMENT   Remove items at home which could result in a fall. This includes throw rugs or furniture in walking pathways ICE to the affected joint every three hours while awake for 30 minutes at a time, for at least the first 3-5 days, and then as needed for pain and swelling.  Continue to use ice for pain and swelling. You may notice swelling that will progress down to the foot and ankle.  This is normal after surgery.  Elevate your leg when you are not up walking on it.   Continue to use the breathing machine you got in the hospital (incentive spirometer) which will help keep your temperature down.  It is common for your temperature to cycle up and down following surgery, especially at night when you are not up moving around and exerting yourself.  The breathing machine keeps your lungs expanded and your temperature down.   DIET:  As you were doing prior to hospitalization, we recommend a well-balanced diet.  DRESSING / WOUND CARE / SHOWERING  Keep the surgical dressing until follow up.  The dressing is water proof, so you can shower without any extra covering.  IF THE DRESSING FALLS OFF or the wound gets wet inside, change the dressing with sterile gauze.   Please use good hand washing techniques before changing the dressing.  Do not use any lotions or creams on the incision until instructed by your surgeon.    ACTIVITY  Increase activity slowly as tolerated, but follow the weight bearing instructions below.   No driving for 6 weeks or until further direction given by your physician.  You cannot drive while taking narcotics.  No lifting or carrying greater than 10 lbs. until further directed by your surgeon. Avoid periods of inactivity such as sitting longer than an hour when not asleep. This helps prevent blood clots.  You may return to work once you are authorized by your doctor.     WEIGHT BEARING   Weight bearing as tolerated with assist device (walker, cane, etc) as directed, use it as long as suggested by your surgeon or therapist, typically at least 4-6 weeks.   EXERCISES  Results after joint replacement surgery are often greatly improved when you follow the exercise, range of motion and muscle strengthening exercises prescribed by your doctor. Safety measures are also important to protect the joint from further injury. Any time any of these exercises cause you to have increased pain or swelling, decrease what you are doing until you are comfortable again and then slowly increase them. If you have problems or questions, call your caregiver or physical therapist for advice.   Rehabilitation is important following a joint replacement. After just a few days of immobilization, the muscles of the leg can become weakened and shrink (atrophy).  These exercises are designed to build up the tone and strength of the thigh and leg muscles and to improve motion. Often times heat used for twenty to thirty minutes before working out will loosen up your tissues and help with improving the range of motion but do not use heat  for the first two weeks following surgery (sometimes heat can increase post-operative swelling).   These exercises can be done on a  training (exercise) mat, on the floor, on a table or on a bed. Use whatever works the best and is most comfortable for you.    Use music or television while you are exercising so that the exercises are a pleasant break in your day. This will make your life better with the exercises acting as a break in your routine that you can look forward to.   Perform all exercises about fifteen times, three times per day or as directed.  You should exercise both the operative leg and the other leg as well.  Exercises include:   Quad Sets - Tighten up the muscle on the front of the thigh (Quad) and hold for 5-10 seconds.   Straight Leg Raises - With your knee straight (if you were given a brace, keep it on), lift the leg to 60 degrees, hold for 3 seconds, and slowly lower the leg.  Perform this exercise against resistance later as your leg gets stronger.  Leg Slides: Lying on your back, slowly slide your foot toward your buttocks, bending your knee up off the floor (only go as far as is comfortable). Then slowly slide your foot back down until your leg is flat on the floor again.  Angel Wings: Lying on your back spread your legs to the side as far apart as you can without causing discomfort.  Hamstring Strength:  Lying on your back, push your heel against the floor with your leg straight by tightening up the muscles of your buttocks.  Repeat, but this time bend your knee to a comfortable angle, and push your heel against the floor.  You may put a pillow under the heel to make it more comfortable if necessary.   A rehabilitation program following joint replacement surgery can speed recovery and prevent re-injury in the future due to weakened muscles. Contact your doctor or a physical therapist for more information on knee rehabilitation.    CONSTIPATION  Constipation is defined medically as fewer than three stools per week and severe constipation as less than one stool per week.  Even if you have a regular bowel  pattern at home, your normal regimen is likely to be disrupted due to multiple reasons following surgery.  Combination of anesthesia, postoperative narcotics, change in appetite and fluid intake all can affect your bowels.   YOU MUST use at least one of the following options; they are listed in order of increasing strength to get the job done.  They are all available over the counter, and you may need to use some, POSSIBLY even all of these options:    Drink plenty of fluids (prune juice may be helpful) and high fiber foods Colace 100 mg by mouth twice a day  Senokot for constipation as directed and as needed Dulcolax (bisacodyl), take with full glass of water  Miralax (polyethylene glycol) once or twice a day as needed.  If you have tried all these things and are unable to have a bowel movement in the first 3-4 days after surgery call either your surgeon or your primary doctor.    If you experience loose stools or diarrhea, hold the medications until you stool forms back up.  If your symptoms do not get better within 1 week or if they get worse, check with your doctor.  If you experience "the worst abdominal pain ever" or develop  nausea or vomiting, please contact the office immediately for further recommendations for treatment.   ITCHING:  If you experience itching with your medications, try taking only a single pain pill, or even half a pain pill at a time.  You can also use Benadryl over the counter for itching or also to help with sleep.   TED HOSE STOCKINGS:  Use stockings on both legs until for at least 2 weeks or as directed by physician office. They may be removed at night for sleeping.  MEDICATIONS:  See your medication summary on the "After Visit Summary" that nursing will review with you.  You may have some home medications which will be placed on hold until you complete the course of blood thinner medication.  It is important for you to complete the blood thinner medication as  prescribed.  PRECAUTIONS:  If you experience chest pain or shortness of breath - call 911 immediately for transfer to the hospital emergency department.   If you develop a fever greater that 101 F, purulent drainage from wound, increased redness or drainage from wound, foul odor from the wound/dressing, or calf pain - CONTACT YOUR SURGEON.                                                   FOLLOW-UP APPOINTMENTS:  If you do not already have a post-op appointment, please call the office for an appointment to be seen by your surgeon.  Guidelines for how soon to be seen are listed in your "After Visit Summary", but are typically between 1-4 weeks after surgery.  POST-OPERATIVE OPIOID TAPER INSTRUCTIONS: It is important to wean off of your opioid medication as soon as possible. If you do not need pain medication after your surgery it is ok to stop day one. Opioids include: Codeine, Hydrocodone(Norco, Vicodin), Oxycodone(Percocet, oxycontin) and hydromorphone amongst others.  Long term and even short term use of opiods can cause: Increased pain response Dependence Constipation Depression Respiratory depression And more.  Withdrawal symptoms can include Flu like symptoms Nausea, vomiting And more Techniques to manage these symptoms Hydrate well Eat regular healthy meals Stay active Use relaxation techniques(deep breathing, meditating, yoga) Do Not substitute Alcohol to help with tapering If you have been on opioids for less than two weeks and do not have pain than it is ok to stop all together.  Plan to wean off of opioids This plan should start within one week post op of your joint replacement. Maintain the same interval or time between taking each dose and first decrease the dose.  Cut the total daily intake of opioids by one tablet each day Next start to increase the time between doses. The last dose that should be eliminated is the evening dose.   MAKE SURE YOU:  Understand these  instructions.  Get help right away if you are not doing well or get worse.    Thank you for letting us be a part of your medical care team.  It is a privilege we respect greatly.  We hope these instructions will help you stay on track for a fast and full recovery!    Dental Antibiotics:  In most cases prophylactic antibiotics for Dental procdeures after total joint surgery are not necessary.  Exceptions are as follows:  1. History of prior total joint infection  2. Severely immunocompromised (Organ  Transplant, cancer chemotherapy, Rheumatoid biologic meds such as Humera)  3. Poorly controlled diabetes (A1C &gt; 8.0, blood glucose over 200)  If you have one of these conditions, contact your surgeon for an antibiotic prescription, prior to your dental procedure.   Increase activity slowly as tolerated   Complete by: As directed    Post-operative opioid taper instructions:   Complete by: As directed    POST-OPERATIVE OPIOID TAPER INSTRUCTIONS: It is important to wean off of your opioid medication as soon as possible. If you do not need pain medication after your surgery it is ok to stop day one. Opioids include: Codeine, Hydrocodone(Norco, Vicodin), Oxycodone(Percocet, oxycontin) and hydromorphone amongst others.  Long term and even short term use of opiods can cause: Increased pain response Dependence Constipation Depression Respiratory depression And more.  Withdrawal symptoms can include Flu like symptoms Nausea, vomiting And more Techniques to manage these symptoms Hydrate well Eat regular healthy meals Stay active Use relaxation techniques(deep breathing, meditating, yoga) Do Not substitute Alcohol to help with tapering If you have been on opioids for less than two weeks and do not have pain than it is ok to stop all together.  Plan to wean off of opioids This plan should start within one week post op of your joint replacement. Maintain the same interval or time  between taking each dose and first decrease the dose.  Cut the total daily intake of opioids by one tablet each day Next start to increase the time between doses. The last dose that should be eliminated is the evening dose.           Follow-up Information     [facility] Follow up on 03/01/2022.   Specialty: Orthopedics Why: 9:45 am with Dr. Molli Hazard information: [address] [place] [address] [phone]                 Signed: Julieanne Cotton 03/01/2022, 10:17 AM

## 2022-03-02 ENCOUNTER — Encounter: Payer: Self-pay | Admitting: Rehabilitative and Restorative Service Providers"

## 2022-03-02 ENCOUNTER — Ambulatory Visit (INDEPENDENT_AMBULATORY_CARE_PROVIDER_SITE_OTHER): Payer: 59 | Admitting: Rehabilitative and Restorative Service Providers"

## 2022-03-02 DIAGNOSIS — R262 Difficulty in walking, not elsewhere classified: Secondary | ICD-10-CM

## 2022-03-02 DIAGNOSIS — M25652 Stiffness of left hip, not elsewhere classified: Secondary | ICD-10-CM

## 2022-03-02 DIAGNOSIS — M25552 Pain in left hip: Secondary | ICD-10-CM | POA: Diagnosis not present

## 2022-03-02 DIAGNOSIS — R600 Localized edema: Secondary | ICD-10-CM

## 2022-03-02 NOTE — Therapy (Signed)
OUTPATIENT PHYSICAL THERAPY LOWER EXTREMITY EVALUATION   Patient Name: Jon Ramos MRN: [id number] DOB:June 20, 1966, 56 y.o., male Today's Date: 03/02/2022   PT End of Session - 03/02/22 1655     Visit Number 1    Number of Visits 15    PT Start Time 0847    PT Stop Time 0930    PT Time Calculation (min) 43 min    Activity Tolerance Patient tolerated treatment well;No increased pain    Behavior During Therapy WFL for tasks assessed/performed             Past Medical History:  Diagnosis Date   Bipolar 1 disorder (HCC)    Hypertension    Past Surgical History:  Procedure Laterality Date   TOTAL HIP ARTHROPLASTY Left 02/15/2022   Procedure: LEFT TOTAL HIP ARTHROPLASTY ANTERIOR APPROACH;  Surgeon: Cammy Copa, MD;  Location: [facility] OR;  Service: Orthopedics;  Laterality: Left;   TYMPANOSTOMY TUBE PLACEMENT     as a child   Patient Active Problem List   Diagnosis Date Noted   S/P hip replacement, 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   Seizures (HCC) 01/01/2020   Alcohol abuse 01/01/2020   Bipolar 1 disorder (HCC) 12/31/2019   Syncope and collapse 12/25/2019   Bipolar 1 disorder with moderate mania (HCC) 12/25/2019    PCP: NA  REFERRING PROVIDER: Cammy Copa, MD  REFERRING DIAG: [phone] (ICD-10-CM) - History of total left hip arthroplasty   THERAPY DIAG:  Difficulty in walking, not elsewhere classified  Localized edema  Stiffness of left hip, not elsewhere classified  Pain in left hip  Rationale for Evaluation and Treatment Rehabilitation  ONSET DATE: 02/15/2022  SUBJECTIVE:   SUBJECTIVE STATEMENT: Jon Ramos had his surgery 2 weeks ago.  Getting in and out of a car, putting on socks and shoes  PERTINENT HISTORY: Bipolar, HTN, history of seizures  PAIN:  Are you having pain? Yes: NPRS scale: 2-4/10 this week on a 10/10 Pain location: Anterior and lateral thigh,  Pain description: Sore, achy Aggravating factors: Prolonged postures Relieving factors: Change of  position  PRECAUTIONS: Anterior hip  WEIGHT BEARING RESTRICTIONS No  FALLS:  Has patient fallen in last 6 months? No  LIVING ENVIRONMENT: Lives with: lives with their spouse Lives in: House/apartment Stairs:  Does well with the handrail Has following equipment at home: None  OCCUPATION: Sits at a computer  PLOF: Independent  PATIENT GOALS Return to gardening, walking, get to the gym   OBJECTIVE:   DIAGNOSTIC FINDINGS: AP lateral radiographs left hip reviewed.  Total hip prosthesis in good  position alignment with no complicating features.  Leg lengths  approximately equal.  No periprosthetic fracture  PATIENT SURVEYS:  FOTO 49 (Goal 68 in 15 visits)  COGNITION:  Overall cognitive status: Within functional limits for tasks assessed     SENSATION: No complaints of radicular pain or paresthesias  MUSCLE LENGTH: Hamstrings: Right 40 deg; Left 35 deg  LOWER EXTREMITY ROM:  Passive ROM Right eval Left eval  Hip flexion 80 65  Hip extension    Hip abduction    Hip adduction    Hip internal rotation 15 5  Hip external rotation 28 17  Knee flexion    Knee extension    Ankle dorsiflexion    Ankle plantarflexion    Ankle inversion    Ankle eversion     (Blank rows = not tested)  LOWER EXTREMITY MMT:  MMT Right eval Left eval  Hip flexion 5/5 2/5  Hip extension  Hip abduction 5/5 3/5  Hip adduction    Hip internal rotation    Hip external rotation    Knee flexion    Knee extension    Ankle dorsiflexion    Ankle plantarflexion    Ankle inversion    Ankle eversion     (Blank rows = not tested)  GAIT: Distance walked: 200 feet Assistive device utilized: None Level of assistance: Complete Independence Comments: L lateral lean and pain with singl-leg WB on the L.  Less pain with increased speed but hip abductors strength needs to be improved.    TODAY'S TREATMENT: 03/02/2022 Standing lumbar/hip extension 10X 3 seconds Hip hike in door frame 2  sets of 5 for 3 seconds Single knee to chest stretch 4X 20 seconds Figure 4 stretch 4X 20 seconds   PATIENT EDUCATION:  Education details: Reviwe HEP Person educated: Patient Education method: Programmer, multimedia, Demonstration, Verbal cues, and Handouts Education comprehension: verbalized understanding, returned demonstration, verbal cues required, and needs further education   HOME EXERCISE PROGRAM: Access Code: [email or web address] URL: [email or web address] Date: 03/02/2022 Prepared by: Pauletta Browns  Exercises - Single Knee to Chest Stretch  - 2-3 x daily - 7 x weekly - 1 sets - 4-5 reps - 20 seconds hold - Supine Figure 4 Piriformis Stretch  - 2-3 x daily - 7 x weekly - 1 sets - 4-5 reps - 20 seconds hold - Standing Lumbar Extension at Wall - Forearms  - 5 x daily - 7 x weekly - 1 sets - 5 reps - 3 seconds hold - Standing Hip Hiking  - 5 x daily - 7 x weekly - 1 sets - 5 reps - 3 seconds hold  ASSESSMENT:  CLINICAL IMPRESSION: Patient is a 56 y.o. male who was seen today for physical therapy evaluation and treatment for L THA.    OBJECTIVE IMPAIRMENTS Abnormal gait, decreased activity tolerance, decreased balance, decreased endurance, decreased knowledge of condition, decreased mobility, difficulty walking, decreased ROM, decreased strength, increased edema, impaired perceived functional ability, impaired flexibility, and pain.   ACTIVITY LIMITATIONS bending, sitting, standing, squatting, sleeping, stairs, and locomotion level  PARTICIPATION LIMITATIONS: driving, community activity, and occupation  PERSONAL FACTORS Bipolar, HTN, seizures are also affecting patient's functional outcome.   REHAB POTENTIAL: Good  CLINICAL DECISION MAKING: Stable/uncomplicated  EVALUATION COMPLEXITY: Low   GOALS: Goals reviewed with patient? Yes  SHORT TERM GOALS: Target date: 03/30/2022  Jon Ramos will be independent with his week 1 HEP Baseline: Started 03/02/2022 Goal status: INITIAL  2.   Jon Ramos will be able to stand and walk for > 15 and 10 minutes (respectively) without pain > 2/10 on the VAS Baseline: 4/10 Goal status: INITIAL   LONG TERM GOALS: Target date: 04/27/2022   Improve FOTO to 68 Baseline: 49 Goal status: INITIAL  2.  Jon Ramos will report Lt hip/groin pain of consistently 0-2/10 on the VAS Baseline: 2-4/10 Goal status: INITIAL  3.  Improve Bil LE flexibility for hip flexors to 90 degrees, hamstrings to 40 degrees and ER rotation to 40 degrees Baseline: 65/80; 35/40; 17/28 respectively Goal status: INITIAL  4.  Improve L hip flexors and abductors strength to at least 4/5 MMT Baseline: 2-3/5 MMT Goal status: INITIAL  5.  Jon Ramos will be independent in his long-term HEP Baseline: Started 03/02/2022 Goal status: INITIAL   PLAN: PT FREQUENCY: 1-2x/week  PT DURATION: 8 weeks  PLANNED INTERVENTIONS: Therapeutic exercises, Therapeutic activity, Neuromuscular re-education, Balance training, Gait training, Patient/Family education, Self Care, Stair training, Cryotherapy, and Manual therapy  PLAN FOR NEXT SESSION: Address flexibility, strength, balance, functional and gait impairments noted above.   Cherlyn Cushing, PT, MPT 03/02/2022, 4:57 PM

## 2022-03-09 ENCOUNTER — Encounter: Payer: Self-pay | Admitting: Rehabilitative and Restorative Service Providers"

## 2022-03-09 ENCOUNTER — Ambulatory Visit (INDEPENDENT_AMBULATORY_CARE_PROVIDER_SITE_OTHER): Payer: 59 | Admitting: Rehabilitative and Restorative Service Providers"

## 2022-03-09 DIAGNOSIS — R600 Localized edema: Secondary | ICD-10-CM

## 2022-03-09 DIAGNOSIS — M25652 Stiffness of left hip, not elsewhere classified: Secondary | ICD-10-CM | POA: Diagnosis not present

## 2022-03-09 DIAGNOSIS — R262 Difficulty in walking, not elsewhere classified: Secondary | ICD-10-CM | POA: Diagnosis not present

## 2022-03-09 DIAGNOSIS — M25552 Pain in left hip: Secondary | ICD-10-CM | POA: Diagnosis not present

## 2022-03-09 NOTE — Therapy (Signed)
OUTPATIENT PHYSICAL THERAPY TREATMENT NOTE   Patient Name: Jon Ramos MRN: [id number] DOB:21-Oct-1965, 56 y.o., male Today's Date: 03/09/2022  END OF SESSION:   PT End of Session - 03/09/22 1148     Visit Number 2    Number of Visits 15    PT Start Time 1144    PT Stop Time 1224    PT Time Calculation (min) 40 min    Activity Tolerance Patient tolerated treatment well;No increased pain    Behavior During Therapy WFL for tasks assessed/performed             Past Medical History:  Diagnosis Date   Bipolar 1 disorder (HCC)    Hypertension    Past Surgical History:  Procedure Laterality Date   TOTAL HIP ARTHROPLASTY Left 02/15/2022   Procedure: LEFT TOTAL HIP ARTHROPLASTY ANTERIOR APPROACH;  Surgeon: Cammy Copa, MD;  Location: [facility] OR;  Service: Orthopedics;  Laterality: Left;   TYMPANOSTOMY TUBE PLACEMENT     as a child   Patient Active Problem List   Diagnosis Date Noted   S/P hip replacement, 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   Seizures (HCC) 01/01/2020   Alcohol abuse 01/01/2020   Bipolar 1 disorder (HCC) 12/31/2019   Syncope and collapse 12/25/2019   Bipolar 1 disorder with moderate mania (HCC) 12/25/2019     THERAPY DIAG:  Difficulty in walking, not elsewhere classified  Localized edema  Stiffness of left hip, not elsewhere classified  Pain in left hip   PCP: NA  REFERRING PROVIDER: Cammy Copa, MD  REFERRING DIAG: [id number] (ICD-10-CM) - History of total left hip arthroplasty   Rationale for Evaluation and Treatment Rehabilitation  ONSET DATE: 02/15/2022  SUBJECTIVE:   SUBJECTIVE STATEMENT: He did the exercises a few times, too painful to do the supine exercises (mostly piriformis stretch) but is doing the standing exercises. He still has tenderness and pain at anterior hip and thigh down to knee. He is currently working and gets up every hour. He is looking to get back to gardening hopefully in time for autumn. He currently has the most issues with  getting in and out of his car, needing to manually lift his Lt leg with his hands to move it.  PERTINENT HISTORY: Bipolar, HTN, history of seizures  PAIN:  Are you having pain? Yes: NPRS scale: 2-3/10 this week ranged 2-4/10 Pain location: Anterior and lateral thigh,  Pain description: Sore, achy Aggravating factors: Prolonged postures Relieving factors: Change of position  PRECAUTIONS: Anterior hip  WEIGHT BEARING RESTRICTIONS No  FALLS:  Has patient fallen in last 6 months? No  LIVING ENVIRONMENT: Lives with: lives with their spouse Lives in: House/apartment Stairs: Does well with the handrail Has following equipment at home: None  OCCUPATION: Sits at a computer  PLOF: Independent  PATIENT GOALS Return to gardening, walking, get to the gym   OBJECTIVE:   DIAGNOSTIC FINDINGS: AP lateral radiographs left hip reviewed.  Total hip prosthesis in good position alignment with no complicating features.  Leg lengths approximately equal.  No periprosthetic fracture  PATIENT SURVEYS:  FOTO 49 (Goal 68 in 15 visits)  SENSATION: No complaints of radicular pain or paresthesias  MUSCLE LENGTH: Hamstrings: Right 40 deg; Left 35 deg  LOWER EXTREMITY ROM:  Passive ROM Right eval Left eval  Hip flexion 80 65  Hip extension    Hip abduction    Hip adduction    Hip internal rotation 15 5  Hip external rotation 28 17  Knee flexion  Knee extension    Ankle dorsiflexion    Ankle plantarflexion    Ankle inversion    Ankle eversion     (Blank rows = not tested)  LOWER EXTREMITY MMT:  MMT Right eval Left eval Left 03/09/22  Hip flexion 5/5 2/5 3/5  Hip extension     Hip abduction 5/5 3/5   Hip adduction     Hip internal rotation     Hip external rotation     Knee flexion     Knee extension     Ankle dorsiflexion     Ankle plantarflexion     Ankle inversion     Ankle eversion      (Blank rows = not tested)  GAIT: Distance walked: 200 feet Assistive device  utilized: None Level of assistance: Complete Independence Comments: L lateral lean and pain with single-leg WB on the L.  Less pain with increased speed but hip abductors strength needs to be improved.    TODAY'S TREATMENT: 03/09/2022 Therex: Nustep seat 11 level 5 for 8 minutes Seated SLR LLE 2 x 6 with difficulty and muscle-related pain Sidelying clamshell LLE x 10 reps Supine piriformis figure 4 stretch 1 x 20s RLE with Lt thigh pain at point of contact, advised to stop doing until thigh tenderness decreases, 2 x 30s LLE with cueing for positioning to decrease hip pain with stretch Hip hike in door frame x 10 reps bil. with additional cueing for form Standing marching with UE counter support then without UE support, with additional cueing for neutral pelvis and hip abduction 1 loss of balance self recovered  Verbal review of all HEP from first visit.   03/02/2022 Standing lumbar/hip extension 10X 3 seconds Hip hike in door frame 2 sets of 5 for 3 seconds Single knee to chest stretch 4X 20 seconds Figure 4 stretch 4X 20 seconds   PATIENT EDUCATION:  03/09/2022 Education details: HEP  progression Person educated: Patient Education method: Programmer, multimedia, Facilities manager, Verbal cues, and Handouts Education comprehension: verbalized understanding, returned demonstration, verbal cues required, and needs further education   HOME EXERCISE PROGRAM: Access Code: [email or web address] URL: [email or web address] Date: 03/09/2022 Prepared by: Chyrel Masson  Exercises - Single Knee to Chest Stretch  - 2-3 x daily - 7 x weekly - 1 sets - 4-5 reps - 20 seconds hold - Supine Figure 4 Piriformis Stretch  - 2-3 x daily - 7 x weekly - 1 sets - 4-5 reps - 20 seconds hold - Standing Lumbar Extension at Wall - Forearms  - 5 x daily - 7 x weekly - 1 sets - 5 reps - 3 seconds hold - Standing Hip Hiking  - 5 x daily - 7 x weekly - 1 sets - 5 reps - 3 seconds hold - Seated Straight Leg Heel Taps  - 1-2  x daily - 7 x weekly - 1-2 sets - 5-10 reps - Clamshell  - 1-2 x daily - 7 x weekly - 2-3 sets - 10 reps  ASSESSMENT:  CLINICAL IMPRESSION: He continues to present with Lt sided hip weakness in functional activity and gait, so HEP was updated to include additional hip flexion and abduction strengthening exercises. HEP was reviewed as he noted pain and difficult with some exercises with cueing for positioning and form. He continues to benefit from skilled PT to address mobility, strength, gait, and balance deficits to improve functional abilities.  OBJECTIVE IMPAIRMENTS Abnormal gait, decreased activity tolerance, decreased balance, decreased endurance, decreased knowledge of condition, decreased mobility, difficulty  walking, decreased ROM, decreased strength, increased edema, impaired perceived functional ability, impaired flexibility, and pain.   ACTIVITY LIMITATIONS bending, sitting, standing, squatting, sleeping, stairs, and locomotion level  PARTICIPATION LIMITATIONS: driving, community activity, and occupation  PERSONAL FACTORS Bipolar, HTN, seizures are also affecting patient's functional outcome.   REHAB POTENTIAL: Good  CLINICAL DECISION MAKING: Stable/uncomplicated  EVALUATION COMPLEXITY: Low   GOALS: Goals reviewed with patient? Yes  SHORT TERM GOALS: Target date: 03/30/2022  [name] will be independent with his week 1 HEP Baseline: Started 03/02/2022 Goal status: on going - assessed 03/09/2022  2.  [name] will be able to stand and walk for > 15 and 10 minutes (respectively) without pain > 2/10 on the VAS Baseline: 4/10 Goal status: on going - assessed 03/09/2022   LONG TERM GOALS: Target date: 04/27/2022   Improve FOTO to 68 Baseline: 49 Goal status: INITIAL  2.  [name] will report Lt hip/groin pain of consistently 0-2/10 on the VAS Baseline: 2-4/10 Goal status: INITIAL  3.  Improve Bil LE flexibility for hip flexors to 90 degrees, hamstrings to 40 degrees and ER rotation to  40 degrees Baseline: 65/80; 35/40; 17/28 respectively Goal status: INITIAL  4.  Improve L hip flexors and abductors strength to at least 4/5 MMT Baseline: 2-3/5 MMT Goal status: INITIAL  5.  [name] will be independent in his long-term HEP Baseline: Started 03/02/2022 Goal status: INITIAL   PLAN: PT FREQUENCY: 1-2x/week  PT DURATION: 8 weeks  PLANNED INTERVENTIONS: Therapeutic exercises, Therapeutic activity, Neuromuscular re-education, Balance training, Gait training, Patient/Family education, Self Care, Stair training, Cryotherapy, and Manual therapy  PLAN FOR NEXT SESSION: review and progress updated HEP as indicated, continue to progress hip strengthening and standing balance/tolerance    Berna Bue, Student-PT 03/09/2022, 12:45 PM  This entire session of physical therapy was performed under the direct supervision of PT signing evaluation /treatment. PT reviewed note and agrees.  Chyrel Masson, PT, DPT, OCS, ATC 03/09/2022, 1:21 PM

## 2022-03-10 ENCOUNTER — Other Ambulatory Visit: Payer: Self-pay | Admitting: Surgical

## 2022-03-15 ENCOUNTER — Other Ambulatory Visit: Payer: Self-pay | Admitting: Surgical

## 2022-03-16 ENCOUNTER — Encounter: Payer: Self-pay | Admitting: Rehabilitative and Restorative Service Providers"

## 2022-03-16 ENCOUNTER — Ambulatory Visit (INDEPENDENT_AMBULATORY_CARE_PROVIDER_SITE_OTHER): Payer: 59 | Admitting: Rehabilitative and Restorative Service Providers"

## 2022-03-16 DIAGNOSIS — M25552 Pain in left hip: Secondary | ICD-10-CM

## 2022-03-16 DIAGNOSIS — M25652 Stiffness of left hip, not elsewhere classified: Secondary | ICD-10-CM | POA: Diagnosis not present

## 2022-03-16 DIAGNOSIS — R262 Difficulty in walking, not elsewhere classified: Secondary | ICD-10-CM

## 2022-03-16 DIAGNOSIS — R600 Localized edema: Secondary | ICD-10-CM | POA: Diagnosis not present

## 2022-03-16 NOTE — Therapy (Signed)
OUTPATIENT PHYSICAL THERAPY TREATMENT NOTE   Patient Name: Jon Ramos MRN: [id number] DOB:03/26/1966, 56 y.o., male Today's Date: 03/16/2022  END OF SESSION:   PT End of Session - 03/16/22 0927     Visit Number 3    Number of Visits 15    Date for PT Re-Evaluation 04/27/22    PT Start Time 0926    PT Stop Time 1011    PT Time Calculation (min) 45 min    Activity Tolerance Patient tolerated treatment well;No increased pain    Behavior During Therapy WFL for tasks assessed/performed              Past Medical History:  Diagnosis Date   Bipolar 1 disorder ([place])    Hypertension    Past Surgical History:  Procedure Laterality Date   TOTAL HIP ARTHROPLASTY Left 02/15/2022   Procedure: LEFT TOTAL HIP ARTHROPLASTY ANTERIOR APPROACH;  Surgeon: Meredith Pel, MD;  Location: [facility];  Service: Orthopedics;  Laterality: Left;   TYMPANOSTOMY TUBE PLACEMENT     as a child   Patient Active Problem List   Diagnosis Date Noted   S/P hip replacement, 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   Seizures ([place]) 01/01/2020   Alcohol abuse 01/01/2020   Bipolar 1 disorder ([place]) 12/31/2019   Syncope and collapse 12/25/2019   Bipolar 1 disorder with moderate mania ([place]) 12/25/2019     THERAPY DIAG:  Difficulty in walking, not elsewhere classified  Localized edema  Stiffness of left hip, not elsewhere classified  Pain in left hip   PCP: NA  REFERRING PROVIDER: Meredith Pel, MD  REFERRING DIAG: [phone] (ICD-10-CM) - History of total left hip arthroplasty   Rationale for Evaluation and Treatment Rehabilitation  ONSET DATE: 02/15/2022  SUBJECTIVE:   SUBJECTIVE STATEMENT: Jon Ramos notes he has been able to get his socks on and off independently the past day or so.  He would like to be able to get regular shoes on and wash his feet, 2 activities that are limited by his current hip flexion AROM.  PERTINENT HISTORY: Bipolar, HTN, history of seizures  PAIN:  Are you having pain? Yes: NPRS scale:  2-4/10 this week  Pain location: Anterior and lateral thigh,  Pain description: Sore, achy Aggravating factors: Prolonged postures Relieving factors: Change of position  PRECAUTIONS: Anterior hip  WEIGHT BEARING RESTRICTIONS No  FALLS:  Has patient fallen in last 6 months? No  LIVING ENVIRONMENT: Lives with: lives with their spouse Lives in: House/apartment Stairs: Does well with the handrail Has following equipment at home: None  OCCUPATION: Sits at a computer  PLOF: [place] Return to gardening, walking, get to the gym   OBJECTIVE:   DIAGNOSTIC FINDINGS: AP lateral radiographs left hip reviewed.  Total hip prosthesis in good position alignment with no complicating features.  Leg lengths approximately equal.  No periprosthetic fracture  PATIENT SURVEYS:  FOTO 49 (Goal 68 in 15 visits)  SENSATION: No complaints of radicular pain or paresthesias  MUSCLE LENGTH: Hamstrings: Right 40 deg; Left 35 deg  LOWER EXTREMITY ROM:  Passive ROM Right eval Left eval  Hip flexion 80 65  Hip extension    Hip abduction    Hip adduction    Hip internal rotation 15 5  Hip external rotation 28 17  Knee flexion    Knee extension    Ankle dorsiflexion    Ankle plantarflexion    Ankle inversion    Ankle eversion     (Blank rows = not tested)  LOWER EXTREMITY MMT:  MMT Right eval Left eval Left 03/09/22  Hip flexion 5/5 2/5 3/5  Hip extension     Hip abduction 5/5 3/5   Hip adduction     Hip internal rotation     Hip external rotation     Knee flexion     Knee extension     Ankle dorsiflexion     Ankle plantarflexion     Ankle inversion     Ankle eversion      (Blank rows = not tested)  GAIT: Distance walked: 200 feet Assistive device utilized: None Level of assistance: Complete Independence Comments: L lateral lean and pain with single-leg WB on the L.  Less pain with increased speed but hip abductors strength needs to be  improved.    TODAY'S TREATMENT: 03/16/2022 Standing lumbar/hip extension 10X 3 seconds Hip hike in door frame 2 sets of 5 for 3 seconds Single knee to chest stretch 4X 20 seconds Figure 4 stretch 4X 20 seconds Seated modified SLR (scissor kick for hip flexion) 10X 5 seconds  Functional Activities: Step-down off 2 inch step 2 sets of 10 Bil Step-up and over 4, 6 and 8 inch step 10X each slow eccentrics Sit to stand slow eccentrics 5X  Neuromuscular re-education: Tandem balance eyes open, head turning and eyes closed   03/09/2022 Therex: Nustep seat 11 level 5 for 8 minutes Seated SLR LLE 2 x 6 with difficulty and muscle-related pain Sidelying clamshell LLE x 10 reps Supine piriformis figure 4 stretch 1 x 20s RLE with Lt thigh pain at point of contact, advised to stop doing until thigh tenderness decreases, 2 x 30s LLE with cueing for positioning to decrease hip pain with stretch Hip hike in door frame x 10 reps bil. with additional cueing for form Standing marching with UE counter support then without UE support, with additional cueing for neutral pelvis and hip abduction 1 loss of balance self recovered  Verbal review of all HEP from first visit.    03/02/2022 Standing lumbar/hip extension 10X 3 seconds Hip hike in door frame 2 sets of 5 for 3 seconds Single knee to chest stretch 4X 20 seconds Figure 4 stretch 4X 20 seconds   PATIENT EDUCATION:  03/09/2022 Education details: HEP  progression Person educated: Patient Education method: Consulting civil engineer, Media planner, Verbal cues, and Handouts Education comprehension: verbalized understanding, returned demonstration, verbal cues required, and needs further education   HOME EXERCISE PROGRAM: Access Code: [email or web address] URL: [email or web address] Date: 03/16/2022 Prepared by: Vista Mink  Exercises - Single Knee to Chest Stretch  - 2-3 x daily - 7 x weekly - 1 sets - 4-5 reps - 20 seconds hold - Supine Figure 4  Piriformis Stretch  - 2-3 x daily - 7 x weekly - 1 sets - 4-5 reps - 20 seconds hold - Standing Lumbar Extension at [place]  - 5 x daily - 7 x weekly - 1 sets - 5 reps - 3 seconds hold - Standing Hip Hiking  - 5 x daily - 7 x weekly - 1 sets - 5 reps - 3 seconds hold - Seated Straight Leg Heel Taps  - 1-2 x daily - 7 x weekly - 1-2 sets - 5-10 reps - Clamshell  - 1-2 x daily - 7 x weekly - 2-3 sets - 10 reps - Tandem Stance  - 1 x daily - 7 x weekly - 1 sets - 5 reps - 20 second hold  ASSESSMENT:  CLINICAL IMPRESSION: Jon Ramos is  making good early progress with his gait, functional hip AROM and self-reported function.  Hip abductors weakness is still noted with gait and stairs and balance activities will continue to improve Jim's gait and overall function.  Continue current POC.  OBJECTIVE IMPAIRMENTS Abnormal gait, decreased activity tolerance, decreased balance, decreased endurance, decreased knowledge of condition, decreased mobility, difficulty walking, decreased ROM, decreased strength, increased edema, impaired perceived functional ability, impaired flexibility, and pain.   ACTIVITY LIMITATIONS bending, sitting, standing, squatting, sleeping, stairs, and locomotion level  PARTICIPATION LIMITATIONS: driving, community activity, and occupation  PERSONAL FACTORS Bipolar, HTN, seizures are also affecting patient's functional outcome.   REHAB POTENTIAL: Good  CLINICAL DECISION MAKING: Stable/uncomplicated  EVALUATION COMPLEXITY: Low   GOALS: Goals reviewed with patient? Yes  SHORT TERM GOALS: Target date: 03/30/2022  Jon Ramos will be independent with his week 1 HEP Baseline: Started 03/02/2022 Goal status: Met 03/16/2022  2.  Jon Ramos will be able to stand and walk for > 15 and 10 minutes (respectively) without pain > 2/10 on the VAS Baseline: 4/10 Goal status: On going - assessed 03/16/2022   LONG TERM GOALS: Target date: 04/27/2022   Improve FOTO to 68 Baseline: 49 Goal status:  INITIAL  2.  Jon Ramos will report Lt hip/groin pain of consistently 0-2/10 on the VAS Baseline: 2-4/10 Goal status: On Going 03/16/2022  3.  Improve Bil LE flexibility for hip flexors to 90 degrees, hamstrings to 40 degrees and ER rotation to 40 degrees Baseline: 65/80; 35/40; 17/28 respectively Goal status: INITIAL  4.  Improve L hip flexors and abductors strength to at least 4/5 MMT Baseline: 2-3/5 MMT Goal status: INITIAL  5.  Jon Ramos will be independent in his long-term HEP Baseline: Started 03/02/2022 Goal status:  On Going 03/16/2022   PLAN: PT FREQUENCY: 1-2x/week  PT DURATION: 8 weeks  PLANNED INTERVENTIONS: Therapeutic exercises, Therapeutic activity, Neuromuscular re-education, Balance training, Gait training, Patient/Family education, Self Care, Stair training, Cryotherapy, and Manual therapy  PLAN FOR NEXT SESSION: Continue to progress hip strengthening and standing balance/gait.  Progress note/FOTO for MD visit 03/29/2022.    Farley Ly, PT, MPT 03/16/2022, 10:15 AM

## 2022-03-23 ENCOUNTER — Ambulatory Visit (INDEPENDENT_AMBULATORY_CARE_PROVIDER_SITE_OTHER): Payer: 59 | Admitting: Rehabilitative and Restorative Service Providers"

## 2022-03-23 ENCOUNTER — Encounter: Payer: Self-pay | Admitting: Rehabilitative and Restorative Service Providers"

## 2022-03-23 DIAGNOSIS — M25552 Pain in left hip: Secondary | ICD-10-CM | POA: Diagnosis not present

## 2022-03-23 DIAGNOSIS — R262 Difficulty in walking, not elsewhere classified: Secondary | ICD-10-CM | POA: Diagnosis not present

## 2022-03-23 DIAGNOSIS — R600 Localized edema: Secondary | ICD-10-CM

## 2022-03-23 DIAGNOSIS — M25652 Stiffness of left hip, not elsewhere classified: Secondary | ICD-10-CM | POA: Diagnosis not present

## 2022-03-23 NOTE — Therapy (Signed)
OUTPATIENT PHYSICAL THERAPY TREATMENT NOTE /PROGRESS NOTE   Patient Name: Jon Ramos MRN: [id number] DOB:1965/10/30, 56 y.o., male Today's Date: 03/23/2022  Progress Note Reporting Period 03/02/2022 to 03/23/2022  See note below for Objective Data and Assessment of Progress/Goals.     END OF SESSION:   PT End of Session - 03/23/22 0850     Visit Number 4    Number of Visits 15    Date for PT Re-Evaluation 04/27/22    PT Start Time 0846    PT Stop Time 0925    PT Time Calculation (min) 39 min    Activity Tolerance Patient tolerated treatment well    Behavior During Therapy [facility] for tasks assessed/performed               Past Medical History:  Diagnosis Date   Bipolar 1 disorder ([place])    Hypertension    Past Surgical History:  Procedure Laterality Date   TOTAL HIP ARTHROPLASTY Left 02/15/2022   Procedure: LEFT TOTAL HIP ARTHROPLASTY ANTERIOR APPROACH;  Surgeon: Meredith Pel, MD;  Location: [facility];  Service: Orthopedics;  Laterality: Left;   TYMPANOSTOMY TUBE PLACEMENT     as a child   Patient Active Problem List   Diagnosis Date Noted   S/P hip replacement, 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   Seizures ([place]) 01/01/2020   Alcohol abuse 01/01/2020   Bipolar 1 disorder ([place]) 12/31/2019   Syncope and collapse 12/25/2019   Bipolar 1 disorder with moderate mania ([place]) 12/25/2019     THERAPY DIAG:  Difficulty in walking, not elsewhere classified  Localized edema  Stiffness of left hip, not elsewhere classified  Pain in left hip   PCP: NA  REFERRING PROVIDER: Meredith Pel, MD  REFERRING DIAG: [phone] (ICD-10-CM) - History of total left hip arthroplasty   Rationale for Evaluation and Treatment Rehabilitation  ONSET DATE: 02/15/2022  SUBJECTIVE:   SUBJECTIVE STATEMENT: Jon Ramos notes he has been able to get his socks on and off independently the past day or so.  He would like to be able to get regular shoes on and wash his feet, 2 activities that are limited by his  current hip flexion AROM.  PERTINENT HISTORY: Bipolar, HTN, history of seizures  PAIN:  Are you having pain? Yes: NPRS scale: 2-4/10 this week  Pain location: Anterior and lateral thigh,  Pain description: Sore, achy Aggravating factors: Prolonged postures Relieving factors: Change of position  PRECAUTIONS: Anterior hip  WEIGHT BEARING RESTRICTIONS No  FALLS:  Has patient fallen in last 6 months? No  LIVING ENVIRONMENT: Lives with: lives with their spouse Lives in: House/apartment Stairs: Does well with the handrail Has following equipment at home: None  OCCUPATION: Sits at a computer  PLOF: [place] Return to gardening, walking, get to the gym   OBJECTIVE:   DIAGNOSTIC FINDINGS: AP lateral radiographs left hip reviewed.  Total hip prosthesis in good position alignment with no complicating features.  Leg lengths approximately equal.  No periprosthetic fracture  PATIENT SURVEYS:  03/23/2022: FOTO update 59  03/02/2022 FOTO 49 (Goal 68 in 15 visits)  SENSATION: 03/02/2022 No complaints of radicular pain or paresthesias  MUSCLE LENGTH: 03/02/2022 Hamstrings: Right 40 deg; Left 35 deg  LOWER EXTREMITY ROM:  Passive ROM Right 03/02/2022 Left 03/02/2022 Lt 03/23/2022 AROM(PROM) in supine  Hip flexion 80 65 90(95)  Hip extension     Hip abduction     Hip adduction     Hip internal rotation 15 5 30(32) in 80  deg hip flexion  Hip external rotation 28 17 40(45) in 80 deg hip flexion  Knee flexion     Knee extension     Ankle dorsiflexion     Ankle plantarflexion     Ankle inversion     Ankle eversion      (Blank rows = not tested)  LOWER EXTREMITY MMT:  MMT Right 03/02/2022 Left 03/02/2022 Left 03/09/22 Left 03/23/2022  Hip flexion 5/5 2/5 3/5 4+/5  Hip extension      Hip abduction 5/5 3/5  3+/5  Hip adduction      Hip internal rotation      Hip external rotation      Knee flexion      Knee extension      Ankle dorsiflexion      Ankle  plantarflexion      Ankle inversion      Ankle eversion       (Blank rows = not tested)  GAIT: 03/02/2022 Distance walked: 200 feet Assistive device utilized: None Level of assistance: Complete Independence Comments: L lateral lean and pain with single-leg WB on the L.  Less pain with increased speed but hip abductors strength needs to be improved.    TODAY'S TREATMENT: 03/23/2022: Therex: UBE LE only lvl 3.5 6 mins  Sidelying Lt hip abduction x 14, x 15 (cues for home progression from clam shell) Supine bridge 2-3 sec hold x 10 (added for home)  Verbal review of existing HEP not performed today in clinic.   TherActivity (to improve ambulation/stairs) Leg press Lt leg 31 lbs 2 x 15  Step on over and down WB on Lt leg 6 inch x 15  Neuro Re-ed Tandem stance on foam 1 min x 2 bilateral Retro step x 15 Lt leg posterior   03/16/2022 Standing lumbar/hip extension 10X 3 seconds Hip hike in door frame 2 sets of 5 for 3 seconds Single knee to chest stretch 4X 20 seconds Figure 4 stretch 4X 20 seconds Seated modified SLR (scissor kick for hip flexion) 10X 5 seconds  Functional Activities: Step-down off 2 inch step 2 sets of 10 Bil Step-up and over 4, 6 and 8 inch step 10X each slow eccentrics Sit to stand slow eccentrics 5X  Neuromuscular re-education: Tandem balance eyes open, head turning and eyes closed   03/09/2022 Therex: Nustep seat 11 level 5 for 8 minutes Seated SLR LLE 2 x 6 with difficulty and muscle-related pain Sidelying clamshell LLE x 10 reps Supine piriformis figure 4 stretch 1 x 20s RLE with Lt thigh pain at point of contact, advised to stop doing until thigh tenderness decreases, 2 x 30s LLE with cueing for positioning to decrease hip pain with stretch Hip hike in door frame x 10 reps bil. with additional cueing for form Standing marching with UE counter support then without UE support, with additional cueing for neutral pelvis and hip abduction 1 loss of  balance self recovered  Verbal review of all HEP from first visit.    PATIENT EDUCATION:  03/23/2022 Education details: HEP  progression Person educated: Patient Education method: Consulting civil engineer, Demonstration, Verbal cues, and Handouts Education comprehension: verbalized understanding, returned demonstration, verbal cues required, and needs further education   HOME EXERCISE PROGRAM: Access Code: [email or web address] URL: [email or web address] Date: 03/23/2022 Prepared by: Scot Jun  Exercises - Single Knee to Chest Stretch  - 2-3 x daily - 7 x weekly - 1 sets - 4-5 reps - 20 seconds hold - Supine Figure 4 Piriformis Stretch  -  2-3 x daily - 7 x weekly - 1 sets - 4-5 reps - 20 seconds hold - Standing Lumbar Extension at Wall - Forearms  - 5 x daily - 7 x weekly - 1 sets - 5 reps - 3 seconds hold - Standing Hip Hiking  - 3-5 x daily - 7 x weekly - 1 sets - 5 reps - 3 seconds hold - Seated Straight Leg Heel Taps  - 1-2 x daily - 7 x weekly - 1-2 sets - 5-10 reps - Tandem Stance  - 1 x daily - 7 x weekly - 1 sets - 5 reps - 20 second hold - Sidelying Hip Abduction  - 1 x daily - 7 x weekly - 1-2 sets - 10-15 reps - Supine Bridge  - 1 x daily - 7 x weekly - 1-2 sets - 10-15 reps - 2 hold  ASSESSMENT:  CLINICAL IMPRESSION: Pt has attended 4 visits overall during course of treatment.  Low level pain indicated at this time.  See objective data for updated information.  Improvements noted in mobility and strength but deficits still noted that can impair and limit functional mobility and return to PLOF including in gait and progressive community mobility. Continued skilled PT services and HEP to benefit Pt.   OBJECTIVE IMPAIRMENTS Abnormal gait, decreased activity tolerance, decreased balance, decreased endurance, decreased knowledge of condition, decreased mobility, difficulty walking, decreased ROM, decreased strength, increased edema, impaired perceived functional ability, impaired  flexibility, and pain.   ACTIVITY LIMITATIONS bending, sitting, standing, squatting, sleeping, stairs, and locomotion level  PARTICIPATION LIMITATIONS: driving, community activity, and occupation  PERSONAL FACTORS Bipolar, HTN, seizures are also affecting patient's functional outcome.   REHAB POTENTIAL: Good  CLINICAL DECISION MAKING: Stable/uncomplicated  EVALUATION COMPLEXITY: Low   GOALS: Goals reviewed with patient? Yes  SHORT TERM GOALS: Target date: 03/30/2022  Jon Ramos will be independent with his week 1 HEP Baseline: Started 03/02/2022 Goal status: Met 03/16/2022  2.  Jon Ramos will be able to stand and walk for > 15 and 10 minutes (respectively) without pain > 2/10 on the VAS Baseline: 4/10 Goal status: Met 9/22/023   LONG TERM GOALS: Target date: 04/27/2022   Improve FOTO to 68  Goal status: on going - assessed 03/23/2022  2.  Jon Ramos will report Lt hip/groin pain of consistently 0-2/10 on the VAS Baseline: 2-4/10 Goal status: Met 03/23/2022  3.  Improve Bil LE flexibility for hip flexors to 90 degrees, hamstrings to 40 degrees and ER rotation to 40 degrees Goal status: on going - assessed 03/23/2022  4.  Improve Lt hip flexors and abductors strength to at least 4/5 MMT  Goal status: on going - assessed 03/23/2022  5.  Jon Ramos will be independent in his long-term HEP Baseline: Started 03/02/2022 Goal status:  On Going 03/16/2022   PLAN: PT FREQUENCY: 1-2x/week  PT DURATION: 8 weeks  PLANNED INTERVENTIONS: Therapeutic exercises, Therapeutic activity, Neuromuscular re-education, Balance training, Gait training, Patient/Family education, Self Care, Stair training, Cryotherapy, and Manual therapy  PLAN FOR NEXT SESSION: Progressive compliant surface balance, Quad and lateral hip strengthening for stability in ambulation.   Scot Jun, PT, DPT, OCS, ATC 03/23/22  9:27 AM

## 2022-03-25 ENCOUNTER — Other Ambulatory Visit: Payer: Self-pay | Admitting: Surgical

## 2022-03-29 ENCOUNTER — Encounter: Payer: Self-pay | Admitting: Orthopedic Surgery

## 2022-03-29 ENCOUNTER — Ambulatory Visit (INDEPENDENT_AMBULATORY_CARE_PROVIDER_SITE_OTHER): Payer: 59 | Admitting: Surgical

## 2022-03-29 DIAGNOSIS — Z96642 Presence of left artificial hip joint: Secondary | ICD-10-CM

## 2022-03-29 NOTE — Progress Notes (Signed)
Post-Op Visit Note   Patient: Jon Ramos           Date of Birth: May 04, 1966           MRN: [id number] Visit Date: 03/29/2022 PCP: Patient, No Pcp Per   Assessment & Plan:  Chief Complaint: No chief complaint on file.  Visit Diagnoses:  1. History of total left hip arthroplasty     Plan: Patient is a 56 year old male who presents s/p left total hip arthroplasty on 02/15/2022.  Doing well overall and feels like he is progressing without difficulty.  He has been going to physical therapy and primarily working on gait training and strengthening.  No fevers or chills.  No chest pain or shortness of breath or calf pain.  No feelings of instability in the hip.  No use of any assistive devices for ambulation.  He states specifically that he is having a lot of improvement in regards to lifting his leg to get into a car and feels a lot better than he has over the past year.  He does note a little bit of numbness and tingling and decreased sensation along the lateral aspect of the thigh and anterior distal thigh.  On exam, patient has incision that is healing well without evidence of infection or dehiscence.  No sinus tract noted.  No calf tenderness.  Negative Homans' sign.  No pain with hip range of motion.  Intact hip flexion strength which is excellent and within 90% strength of the contralateral side on exam today.  He ambulates without any significant antalgia or Trendelenburg gait.  Plan is to continue with physical therapy exercises.  He can transition to home exercise program whenever he feels comfortable.  He is okay to start some gardening and he will monitor his symptoms to see if any symptoms worsen over the next several weeks.  He will follow-up in 6 weeks for final check with Dr. Marlou Sa but he was encouraged to reach out if he has any concerns in the meantime.  Suspect that his numbness and tingling and decreased sensation that is his primary complaint at this point will continue to  improve as it has over the last several weeks.  Follow-Up Instructions: No follow-ups on file.   Orders:  No orders of the defined types were placed in this encounter.  No orders of the defined types were placed in this encounter.   Imaging: No results found.  PMFS History: Patient Active Problem List   Diagnosis Date Noted   S/P hip replacement, 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   Seizures ([place]) 01/01/2020   Alcohol abuse 01/01/2020   Bipolar 1 disorder ([place]) 12/31/2019   Syncope and collapse 12/25/2019   Bipolar 1 disorder with moderate mania ([place]) 12/25/2019   Past Medical History:  Diagnosis Date   Bipolar 1 disorder ([place])    Hypertension     No family history on file.  Past Surgical History:  Procedure Laterality Date   TOTAL HIP ARTHROPLASTY Left 02/15/2022   Procedure: LEFT TOTAL HIP ARTHROPLASTY ANTERIOR APPROACH;  Surgeon: Meredith Pel, MD;  Location: [facility];  Service: Orthopedics;  Laterality: Left;   TYMPANOSTOMY TUBE PLACEMENT     as a child   Social History   Occupational History   Not on file  Tobacco Use   Smoking status: Never   Smokeless tobacco: Never  Vaping Use   Vaping Use: Never used  Substance and Sexual Activity   Alcohol use: No   Drug use: No  Sexual activity: Not on file

## 2022-03-30 ENCOUNTER — Encounter: Payer: Self-pay | Admitting: Rehabilitative and Restorative Service Providers"

## 2022-03-30 ENCOUNTER — Ambulatory Visit (INDEPENDENT_AMBULATORY_CARE_PROVIDER_SITE_OTHER): Payer: 59 | Admitting: Rehabilitative and Restorative Service Providers"

## 2022-03-30 DIAGNOSIS — R600 Localized edema: Secondary | ICD-10-CM | POA: Diagnosis not present

## 2022-03-30 DIAGNOSIS — R262 Difficulty in walking, not elsewhere classified: Secondary | ICD-10-CM

## 2022-03-30 DIAGNOSIS — M25652 Stiffness of left hip, not elsewhere classified: Secondary | ICD-10-CM | POA: Diagnosis not present

## 2022-03-30 DIAGNOSIS — M25552 Pain in left hip: Secondary | ICD-10-CM | POA: Diagnosis not present

## 2022-03-30 NOTE — Therapy (Signed)
OUTPATIENT PHYSICAL THERAPY TREATMENT NOTE  Patient Name: Jon Ramos MRN: [id number] DOB:Sep 13, 1965, 56 y.o., male Today's Date: 03/30/2022  END OF SESSION:   PT End of Session - 03/30/22 0855     Visit Number 5    Number of Visits 15    Date for PT Re-Evaluation 04/27/22    PT Start Time 0843    PT Stop Time 0928    PT Time Calculation (min) 45 min    Activity Tolerance Patient tolerated treatment well;No increased pain;Patient limited by fatigue    Behavior During Therapy [facility] for tasks assessed/performed              Past Medical History:  Diagnosis Date   Bipolar 1 disorder ([place])    Hypertension    Past Surgical History:  Procedure Laterality Date   TOTAL HIP ARTHROPLASTY Left 02/15/2022   Procedure: LEFT TOTAL HIP ARTHROPLASTY ANTERIOR APPROACH;  Surgeon: Meredith Pel, MD;  Location: [place];  Service: Orthopedics;  Laterality: Left;   TYMPANOSTOMY TUBE PLACEMENT     as a child   Patient Active Problem List   Diagnosis Date Noted   S/P hip replacement, 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   Seizures ([place]) 01/01/2020   Alcohol abuse 01/01/2020   Bipolar 1 disorder ([place]) 12/31/2019   Syncope and collapse 12/25/2019   Bipolar 1 disorder with moderate mania ([place]) 12/25/2019     THERAPY DIAG:  Difficulty in walking, not elsewhere classified  Localized edema  Stiffness of left hip, not elsewhere classified  Pain in left hip   PCP: NA  REFERRING PROVIDER: Meredith Pel, MD  REFERRING DIAG: [phone] (ICD-10-CM) - History of total left hip arthroplasty   Rationale for Evaluation and Treatment Rehabilitation  ONSET DATE: 02/15/2022  SUBJECTIVE:   SUBJECTIVE STATEMENT: Jon Ramos notes he is doing better with getting his socks on and off independently, although he still needs help occasionally.  He would like to be able to get regular shoes on and wash his feet, 2 activities that are limited by his current hip flexion and ER AROM.  PERTINENT HISTORY: Bipolar, HTN,  history of seizures  PAIN:  Are you having pain? Yes: NPRS scale: 0-1/10 this week  Pain location: Anterior and lateral thigh,  Pain description: Sore, achy Aggravating factors: Prolonged postures Relieving factors: Change of position  PRECAUTIONS: Anterior hip  WEIGHT BEARING RESTRICTIONS No  FALLS:  Has patient fallen in last 6 months? No  LIVING ENVIRONMENT: Lives with: lives with their spouse Lives in: House/apartment Stairs: Does well with the handrail Has following equipment at home: None  OCCUPATION: Sits at a computer  PLOF: [place] Return to gardening, walking, get to the gym   OBJECTIVE:   DIAGNOSTIC FINDINGS: AP lateral radiographs left hip reviewed.  Total hip prosthesis in good position alignment with no complicating features.  Leg lengths approximately equal.  No periprosthetic fracture  PATIENT SURVEYS:  03/23/2022: FOTO update 59  03/02/2022 FOTO 49 (Goal 68 in 15 visits)  SENSATION: 03/02/2022 No complaints of radicular pain or paresthesias  MUSCLE LENGTH: 03/30/2022 Hamstrings Left 40 degrees  03/02/2022  Hamstrings: Right 40 deg; Left 35 deg  LOWER EXTREMITY ROM:  Passive ROM Right 03/02/2022 Left 03/02/2022 Lt 03/23/2022 AROM(PROM) in supine Left 03/30/2022  Hip flexion 80 65 90(95) 85  Hip extension      Hip abduction      Hip adduction      Hip internal rotation 15 5 30(32) in 80 deg hip flexion 3  Hip external rotation 28 17 40(45) in 80 deg hip flexion 23  Knee flexion      Knee extension      Ankle dorsiflexion      Ankle plantarflexion      Ankle inversion      Ankle eversion       (Blank rows = not tested)  LOWER EXTREMITY MMT:  MMT Right 03/02/2022 Left 03/02/2022 Left 03/09/22 Left 03/23/2022  Hip flexion 5/5 2/5 3/5 4+/5  Hip extension      Hip abduction 5/5 3/5  3+/5  Hip adduction      Hip internal rotation      Hip external rotation      Knee flexion      Knee extension      Ankle dorsiflexion       Ankle plantarflexion      Ankle inversion      Ankle eversion       (Blank rows = not tested)  GAIT: 03/02/2022 Distance walked: 200 feet Assistive device utilized: None Level of assistance: Complete Independence Comments: L lateral lean and pain with single-leg WB on the L.  Less pain with increased speed but hip abductors strength needs to be improved.    TODAY'S TREATMENT:   03/30/2022: Standing lumbar/hip extension 5X 3 seconds Hip hike in door frame 2 sets of 5 for 5 seconds Hip hike and push (IR hip before push) 5X 3 seconds Single knee to chest stretch 4X 20 seconds Figure 4 stretch 4X 20 seconds Seated modified SLR (scissor kick for hip flexion) 10X 5 seconds  Functional Activities: Step-down off 4 inch step 2 sets of 10 Bil Step-up and over 8 inch step 10X each slow eccentrics Sit to stand slow eccentrics 5X  Neuromuscular re-education: Tandem balance eyes open 30 seconds Bil Single leg slow march 5X 3 seconds Bil Wobble board Lt/Rt balance 2X 1 minute   03/23/2022: Therex: UBE LE only lvl 3.5 6 mins  Sidelying Lt hip abduction x 14, x 15 (cues for home progression from clam shell) Supine bridge 2-3 sec hold x 10 (added for home)  Verbal review of existing HEP not performed today in clinic.   TherActivity (to improve ambulation/stairs) Leg press Lt leg 31 lbs 2 x 15  Step on over and down WB on Lt leg 6 inch x 15  Neuro Re-ed Tandem stance on foam 1 min x 2 bilateral Retro step x 15 Lt leg posterior   03/16/2022 Standing lumbar/hip extension 10X 3 seconds Hip hike in door frame 2 sets of 5 for 3 seconds Single knee to chest stretch 4X 20 seconds Figure 4 stretch 4X 20 seconds Seated modified SLR (scissor kick for hip flexion) 10X 5 seconds  Functional Activities: Step-down off 2 inch step 2 sets of 10 Bil Step-up and over 4, 6 and 8 inch step 10X each slow eccentrics Sit to stand slow eccentrics 5X  Neuromuscular re-education: Tandem balance eyes  open, head turning and eyes closed   PATIENT EDUCATION:  03/23/2022 Education details: HEP  progression Person educated: Patient Education method: Consulting civil engineer, Media planner, Verbal cues, and Handouts Education comprehension: verbalized understanding, returned demonstration, verbal cues required, and needs further education   HOME EXERCISE PROGRAM: Access Code: [email or web address] URL: [email or web address] Date: 03/30/2022 Prepared by: Vista Mink  Exercises - Single Knee to Chest Stretch  - 2-3 x daily - 7 x weekly - 1 sets - 4-5 reps - 20 seconds hold - Supine Figure 4 Piriformis Stretch  -  2-3 x daily - 7 x weekly - 1 sets - 4-5 reps - 20 seconds hold - Standing Lumbar Extension at Wall - Forearms  - 5 x daily - 7 x weekly - 1 sets - 5 reps - 3 seconds hold - Standing Hip Hiking  - 3-5 x daily - 7 x weekly - 1 sets - 5 reps - 5 seconds hold - Seated Straight Leg Heel Taps  - 1-2 x daily - 3 x weekly - 1-2 sets - 5-10 reps - Tandem Stance  - 1 x daily - 7 x weekly - 1 sets - 5 reps - 20 second hold - Sidelying Hip Abduction  - 1 x daily - 3 x weekly - 1-2 sets - 10-15 reps - Supine Bridge  - 1 x daily - 3 x weekly - 1-2 sets - 10-15 reps - 2 hold - Runner's March  - 1 x daily - 7 x weekly - 1 sets - 10 reps - 3-5 seconds hold - Lateral Step Down  - 1 x daily - 3 x weekly - 2-3 sets - 10 reps  ASSESSMENT:  CLINICAL IMPRESSION: Jon Ramos is making very good progress for 6 weeks post-L anterior THA.  He has limitations in hip flexion and ER AROM although both are better than at PT visit 1.  Hip abductors and general hip strength and proprioception remain priorities as well.  His program was progressed today and he remains on track to meet FOTO and LTGs in the expected time.  OBJECTIVE IMPAIRMENTS Abnormal gait, decreased activity tolerance, decreased balance, decreased endurance, decreased knowledge of condition, decreased mobility, difficulty walking, decreased ROM, decreased  strength, increased edema, impaired perceived functional ability, impaired flexibility, and pain.   ACTIVITY LIMITATIONS bending, sitting, standing, squatting, sleeping, stairs, and locomotion level  PARTICIPATION LIMITATIONS: driving, community activity, and occupation  PERSONAL FACTORS Bipolar, HTN, seizures are also affecting patient's functional outcome.   REHAB POTENTIAL: Good  CLINICAL DECISION MAKING: Stable/uncomplicated  EVALUATION COMPLEXITY: Low   GOALS: Goals reviewed with patient? Yes  SHORT TERM GOALS: Target date: 03/30/2022  Jon Ramos will be independent with his week 1 HEP Baseline: Started 03/02/2022 Goal status: Met 03/16/2022  2.  Jon Ramos will be able to stand and walk for > 15 and 10 minutes (respectively) without pain > 2/10 on the VAS Baseline: 4/10 Goal status: Met 9/22/023   LONG TERM GOALS: Target date: 04/27/2022   Improve FOTO to 68  Goal status: on going - assessed 03/23/2022  2.  Jon Ramos will report Lt hip/groin pain of consistently 0-2/10 on the VAS Baseline: 2-4/10 Goal status: Met 03/23/2022  3.  Improve Bil LE flexibility for hip flexors to 90 degrees, hamstrings to 40 degrees and ER rotation to 40 degrees Goal status: Partially Met - assessed 03/30/2022  4.  Improve Lt hip flexors and abductors strength to at least 4/5 MMT  Goal status: on going - assessed 03/23/2022  5.  Jon Ramos will be independent in his long-term HEP Baseline: Started 03/02/2022 Goal status:  On Going 03/30/2022   PLAN: PT FREQUENCY: 1-2x/week  PT DURATION: 8 weeks  PLANNED INTERVENTIONS: Therapeutic exercises, Therapeutic activity, Neuromuscular re-education, Balance training, Gait training, Patient/Family education, Self Care, Stair training, Cryotherapy, and Manual therapy  PLAN FOR NEXT SESSION: Progressive compliant surface balance, Quad and lateral hip strengthening for stability in ambulation, stairs.   Farley Ly PT, MPT 03/30/22  9:37 AM

## 2022-04-06 ENCOUNTER — Encounter: Payer: Self-pay | Admitting: Rehabilitative and Restorative Service Providers"

## 2022-04-06 ENCOUNTER — Ambulatory Visit (INDEPENDENT_AMBULATORY_CARE_PROVIDER_SITE_OTHER): Payer: 59 | Admitting: Rehabilitative and Restorative Service Providers"

## 2022-04-06 DIAGNOSIS — R600 Localized edema: Secondary | ICD-10-CM | POA: Diagnosis not present

## 2022-04-06 DIAGNOSIS — M25652 Stiffness of left hip, not elsewhere classified: Secondary | ICD-10-CM

## 2022-04-06 DIAGNOSIS — M25552 Pain in left hip: Secondary | ICD-10-CM

## 2022-04-06 DIAGNOSIS — R262 Difficulty in walking, not elsewhere classified: Secondary | ICD-10-CM | POA: Diagnosis not present

## 2022-04-06 NOTE — Therapy (Signed)
OUTPATIENT PHYSICAL THERAPY TREATMENT NOTE  Patient Name: Jon Ramos MRN: [id number] DOB:05-Oct-1965, 56 y.o., male Today's Date: 04/06/2022  END OF SESSION:   PT End of Session - 04/06/22 1241     Visit Number 6    Number of Visits 15    Date for PT Re-Evaluation 04/27/22    PT Start Time 0846    PT Stop Time 0930    PT Time Calculation (min) 44 min    Activity Tolerance Patient tolerated treatment well;No increased pain;Patient limited by fatigue    Behavior During Therapy [facility] for tasks assessed/performed             Past Medical History:  Diagnosis Date   Bipolar 1 disorder ([place])    Hypertension    Past Surgical History:  Procedure Laterality Date   TOTAL HIP ARTHROPLASTY Left 02/15/2022   Procedure: LEFT TOTAL HIP ARTHROPLASTY ANTERIOR APPROACH;  Surgeon: Meredith Pel, MD;  Location: [place];  Service: Orthopedics;  Laterality: Left;   TYMPANOSTOMY TUBE PLACEMENT     as a child   Patient Active Problem List   Diagnosis Date Noted   S/P hip replacement, 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   Seizures ([place]) 01/01/2020   Alcohol abuse 01/01/2020   Bipolar 1 disorder ([place]) 12/31/2019   Syncope and collapse 12/25/2019   Bipolar 1 disorder with moderate mania ([place]) 12/25/2019     THERAPY DIAG:  Difficulty in walking, not elsewhere classified  Localized edema  Stiffness of left hip, not elsewhere classified  Pain in left hip   PCP: NA  REFERRING PROVIDER: Meredith Pel, MD  REFERRING DIAG: [phone] (ICD-10-CM) - History of total left hip arthroplasty   Rationale for Evaluation and Treatment Rehabilitation  ONSET DATE: 02/15/2022  SUBJECTIVE:   SUBJECTIVE STATEMENT: Jon Ramos notes he can now get his socks on and off independently, although he still needs help washing his feet.  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  PERTINENT HISTORY: Bipolar, HTN, history of seizures  PAIN:  Are you having pain? Yes: NPRS scale: 0-2/10 this  week  Pain location: Anterior and lateral thigh,  Pain description: Sore, achy Aggravating factors: Prolonged postures Relieving factors: Change of position  PRECAUTIONS: Anterior hip  WEIGHT BEARING RESTRICTIONS No  FALLS:  Has patient fallen in last 6 months? No  LIVING ENVIRONMENT: Lives with: lives with their spouse Lives in: House/apartment Stairs: Does well with the handrail Has following equipment at home: None  OCCUPATION: Sits at a computer  PLOF: [place] Return to gardening, walking, get to the gym   OBJECTIVE:   DIAGNOSTIC FINDINGS: AP lateral radiographs left hip reviewed.  Total hip prosthesis in good position alignment with no complicating features.  Leg lengths approximately equal.  No periprosthetic fracture  PATIENT SURVEYS:  03/23/2022: FOTO update 59  03/02/2022 FOTO 49 (Goal 68 in 15 visits)  SENSATION: 03/02/2022 No complaints of radicular pain or paresthesias  MUSCLE LENGTH: 03/30/2022 Hamstrings Left 40 degrees  03/02/2022  Hamstrings: Right 40 deg; Left 35 deg  LOWER EXTREMITY ROM:  Passive ROM Right 03/02/2022 Left 03/02/2022 Lt 03/23/2022 AROM(PROM) in supine Left 03/30/2022  Hip flexion 80 65 90(95) 85  Hip extension      Hip abduction      Hip adduction      Hip internal rotation 15 5 30(32) in 80 deg hip flexion 3  Hip external rotation 28 17 40(45) in 80 deg hip flexion 23  Knee  flexion      Knee extension      Ankle dorsiflexion      Ankle plantarflexion      Ankle inversion      Ankle eversion       (Blank rows = not tested)  LOWER EXTREMITY MMT:  MMT Right 03/02/2022 Left 03/02/2022 Left 03/09/22 Left 03/23/2022  Hip flexion 5/5 2/5 3/5 4+/5  Hip extension      Hip abduction 5/5 3/5  3+/5  Hip adduction      Hip internal rotation      Hip external rotation      Knee flexion      Knee extension      Ankle dorsiflexion      Ankle plantarflexion      Ankle inversion      Ankle eversion       (Blank  rows = not tested)  GAIT: 03/02/2022 Distance walked: 200 feet Assistive device utilized: None Level of assistance: Complete Independence Comments: L lateral lean and pain with single-leg WB on the L.  Less pain with increased speed but hip abductors strength needs to be improved.    TODAY'S TREATMENT: 04/06/2022: Standing lumbar/hip extension 5X 3 seconds Hip hike in door frame 2 sets of 5 for 5 seconds Hip hike and push (IR hip before push) 5X 3 seconds Single knee to chest stretch 4X 20 seconds Figure 4 stretch 4X 20 seconds  Functional Activities: Step-down off 4 inch step 2 sets of 10 Bil Step-up and over 8 inch step 10X each slow eccentrics Sit to stand slow eccentrics 5X  Neuromuscular re-education: Tandem balance eyes open; head turning; eyes closed 2X 30 seconds Bil Single leg slow march 5X 3 seconds Bil Dynamic tandem walk on foam forwards and backwards 5 laps each   03/30/2022: Standing lumbar/hip extension 5X 3 seconds Hip hike in door frame 2 sets of 5 for 5 seconds Hip hike and push (IR hip before push) 5X 3 seconds Single knee to chest stretch 4X 20 seconds Figure 4 stretch 4X 20 seconds Seated modified SLR (scissor kick for hip flexion) 10X 5 seconds  Functional Activities: Step-down off 4 inch step 2 sets of 10 Bil Step-up and over 8 inch step 10X each slow eccentrics Sit to stand slow eccentrics 5X  Neuromuscular re-education: Tandem balance eyes open 30 seconds Bil Single leg slow march 5X 3 seconds Bil Wobble board Lt/Rt balance 2X 1 minute   03/23/2022: Therex: UBE LE only lvl 3.5 6 mins  Sidelying Lt hip abduction x 14, x 15 (cues for home progression from clam shell) Supine bridge 2-3 sec hold x 10 (added for home)  Verbal review of existing HEP not performed today in clinic.   TherActivity (to improve ambulation/stairs) Leg press Lt leg 31 lbs 2 x 15  Step on over and down WB on Lt leg 6 inch x 15  Neuro Re-ed Tandem stance on foam 1  min x 2 bilateral Retro step x 15 Lt leg posterior   PATIENT EDUCATION:  03/23/2022 Education details: HEP  progression Person educated: Patient Education method: Consulting civil engineer, Media planner, Verbal cues, and Handouts Education comprehension: verbalized understanding, returned demonstration, verbal cues required, and needs further education   HOME EXERCISE PROGRAM: Access Code: [email or web address] URL: [email or web address] Date: 03/30/2022 Prepared by: Vista Mink  Exercises - Single Knee to Chest Stretch  - 2-3 x daily - 7 x weekly - 1 sets - 4-5 reps - 20 seconds hold - Supine Figure 4 Piriformis  Stretch  - 2-3 x daily - 7 x weekly - 1 sets - 4-5 reps - 20 seconds hold - Standing Lumbar Extension at Wall - Forearms  - 5 x daily - 7 x weekly - 1 sets - 5 reps - 3 seconds hold - Standing Hip Hiking  - 3-5 x daily - 7 x weekly - 1 sets - 5 reps - 5 seconds hold - Seated Straight Leg Heel Taps  - 1-2 x daily - 3 x weekly - 1-2 sets - 5-10 reps - Tandem Stance  - 1 x daily - 7 x weekly - 1 sets - 5 reps - 20 second hold - Sidelying Hip Abduction  - 1 x daily - 3 x weekly - 1-2 sets - 10-15 reps - Supine Bridge  - 1 x daily - 3 x weekly - 1-2 sets - 10-15 reps - 2 hold - Runner's March  - 1 x daily - 7 x weekly - 1 sets - 10 reps - 3-5 seconds hold - Lateral Step Down  - 1 x daily - 3 x weekly - 2-3 sets - 10 reps  ASSESSMENT:  CLINICAL IMPRESSION: Jon Ramos continues to make gains with his post-surgical PT.  Hip AROM is improved as noted with independence with socks and shoes (improved from last week).  He still can't wash his feet and he has a mild lateral lean with gait.  All remaining impairments are being addressed with his HEP and we will reassess next week to determine if there is a need for continued supervised PT.    OBJECTIVE IMPAIRMENTS Abnormal gait, decreased activity tolerance, decreased balance, decreased endurance, decreased knowledge of condition, decreased mobility,  difficulty walking, decreased ROM, decreased strength, increased edema, impaired perceived functional ability, impaired flexibility, and pain.   ACTIVITY LIMITATIONS bending, sitting, standing, squatting, sleeping, stairs, and locomotion level  PARTICIPATION LIMITATIONS: driving, community activity, and occupation  PERSONAL FACTORS Bipolar, HTN, seizures are also affecting patient's functional outcome.   REHAB POTENTIAL: Good  CLINICAL DECISION MAKING: Stable/uncomplicated  EVALUATION COMPLEXITY: Low   GOALS: Goals reviewed with patient? Yes  SHORT TERM GOALS: Target date: 03/30/2022  Jon Ramos will be independent with his week 1 HEP Baseline: Started 03/02/2022 Goal status: Met 03/16/2022  2.  Jon Ramos will be able to stand and walk for > 15 and 10 minutes (respectively) without pain > 2/10 on the VAS Baseline: 4/10 Goal status: Met 9/22/023   LONG TERM GOALS: Target date: 04/27/2022   Improve FOTO to 68  Goal status: on going - assessed 03/23/2022  2.  Jon Ramos will report Lt hip/groin pain of consistently 0-2/10 on the VAS Baseline: 2-4/10 Goal status: Met 03/23/2022  3.  Improve Bil LE flexibility for hip flexors to 90 degrees, hamstrings to 40 degrees and ER rotation to 40 degrees Goal status: Partially Met - assessed 03/30/2022  4.  Improve Lt hip flexors and abductors strength to at least 4/5 MMT  Goal status: on going - assessed 03/23/2022  5.  Jon Ramos will be independent in his long-term HEP Baseline: Started 03/02/2022 Goal status:  On Going 04/06/2022   PLAN: PT FREQUENCY: 1-2x/week  PT DURATION: 8 weeks  PLANNED INTERVENTIONS: Therapeutic exercises, Therapeutic activity, Neuromuscular re-education, Balance training, Gait training, Patient/Family education, Self Care, Stair training, Cryotherapy, and Manual therapy  PLAN FOR NEXT SESSION: Reassessment and FOTO.  Continue PT vs DC?  Farley Ly PT, MPT 04/06/22  12:47 PM

## 2022-04-13 ENCOUNTER — Ambulatory Visit (INDEPENDENT_AMBULATORY_CARE_PROVIDER_SITE_OTHER): Payer: 59 | Admitting: Rehabilitative and Restorative Service Providers"

## 2022-04-13 ENCOUNTER — Encounter: Payer: Self-pay | Admitting: Rehabilitative and Restorative Service Providers"

## 2022-04-13 DIAGNOSIS — M25552 Pain in left hip: Secondary | ICD-10-CM | POA: Diagnosis not present

## 2022-04-13 DIAGNOSIS — R600 Localized edema: Secondary | ICD-10-CM

## 2022-04-13 DIAGNOSIS — R262 Difficulty in walking, not elsewhere classified: Secondary | ICD-10-CM | POA: Diagnosis not present

## 2022-04-13 DIAGNOSIS — M25652 Stiffness of left hip, not elsewhere classified: Secondary | ICD-10-CM

## 2022-04-13 NOTE — Therapy (Signed)
OUTPATIENT PHYSICAL THERAPY TREATMENT/DISCHARGE NOTE  Patient Name: Jon Ramos MRN: [id number] DOB:[birth year], 56 y.o., male Today's Date: 04/13/2022  PHYSICAL THERAPY DISCHARGE SUMMARY  Visits from Start of Care: 7  Current functional level related to goals / functional outcomes: See note   Remaining deficits: See note   Education / Equipment: Updated HEP   Patient agrees to discharge. Patient goals were met. Patient is being discharged due to being pleased with the current functional level.   END OF SESSION:   PT End of Session - 04/13/22 0936     Visit Number 7    Number of Visits 15    Date for PT Re-Evaluation 04/27/22    PT Start Time 0843    PT Stop Time 0926    PT Time Calculation (min) 43 min    Activity Tolerance Patient tolerated treatment well;No increased pain    Behavior During Therapy WFL for tasks assessed/performed              Past Medical History:  Diagnosis Date   Bipolar 1 disorder ([place])    Hypertension    Past Surgical History:  Procedure Laterality Date   TOTAL HIP ARTHROPLASTY Left 02/15/2022   Procedure: LEFT TOTAL HIP ARTHROPLASTY ANTERIOR APPROACH;  Surgeon: Jon Pel, MD;  Location: [facility];  Service: Orthopedics;  Laterality: Left;   TYMPANOSTOMY TUBE PLACEMENT     as a child   Patient Active Problem List   Diagnosis Date Noted   S/P hip replacement, 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   Seizures ([place]) 01/01/2020   Alcohol abuse 01/01/2020   Bipolar 1 disorder ([place]) 12/31/2019   Syncope and collapse 12/25/2019   Bipolar 1 disorder with moderate mania ([place]) 12/25/2019     THERAPY DIAG:  Difficulty in walking, not elsewhere classified  Localized edema  Stiffness of left hip, not elsewhere classified  Pain in left hip   PCP: NA  REFERRING PROVIDER: Meredith Pel, MD  REFERRING DIAG: [phone] (ICD-10-CM) - History of total left hip arthroplasty   Rationale for Evaluation and Treatment Rehabilitation  ONSET DATE:  02/15/2022  SUBJECTIVE:   SUBJECTIVE STATEMENT: Jon Ramos notes he can now get his socks on and off independently, wash his feet and do his normal yard work.    PERTINENT HISTORY: Bipolar, HTN, history of seizures  PAIN:  Are you having pain? Yes: NPRS scale: 0-2/10 this week  Pain location: Anterior and lateral thigh,  Pain description: Sore, achy Aggravating factors: Prolonged postures and late in the day with a heavy physical demand level Relieving factors: Rest and exercises  PRECAUTIONS: Anterior hip  WEIGHT BEARING RESTRICTIONS No  FALLS:  Has patient fallen in last 6 months? No  LIVING ENVIRONMENT: Lives with: lives with their spouse Lives in: House/apartment Stairs: Does well with the handrail Has following equipment at home: None  OCCUPATION: Sits at a computer  PLOF: [place] Return to gardening, walking, get to the gym   OBJECTIVE:   DIAGNOSTIC FINDINGS: AP lateral radiographs left hip reviewed.  Total hip prosthesis in good position alignment with no complicating features.  Leg lengths approximately equal.  No periprosthetic fracture  PATIENT SURVEYS:   04/13/2022  FOTO Update 75 (Goal met)  03/23/2022: FOTO Update 59  03/02/2022 FOTO 49 (Goal 68 in 15 visits)  SENSATION: 03/02/2022 No complaints of radicular pain or paresthesias  MUSCLE LENGTH: 04/13/2022 Hamstrings Left 50 degrees; Right 50 degrees  03/30/2022 Hamstrings Left 40 degrees  03/02/2022  Hamstrings: Right 40 deg; Left 35  deg  LOWER EXTREMITY ROM:  Passive ROM Right 03/02/2022 Left 03/02/2022 Lt 03/23/2022 AROM(PROM) in supine Left 03/30/2022 Lt/Rt 04/13/2022  Hip flexion 80 65 90(95) 85 95/90  Hip extension       Hip abduction       Hip adduction       Hip internal rotation 15 5 30(32) in 80 deg hip flexion 3 15/15  Hip external rotation 28 17 40(45) in 80 deg hip flexion 23 25/30  Knee flexion       Knee extension       Ankle dorsiflexion       Ankle  plantarflexion       Ankle inversion       Ankle eversion        (Blank rows = not tested)  LOWER EXTREMITY MMT:  MMT Right 03/02/2022 Left 03/02/2022 Left 03/09/22 Left 03/23/2022 Lt/Rt in degrees 04/13/2022  Hip flexion 5/5 2/5 3/5 4+/5 5/5 & 5/5  Hip extension       Hip abduction 5/5 3/5  3+/5 4/5 & 5/5  Hip adduction       Hip internal rotation       Hip external rotation       Knee flexion       Knee extension       Ankle dorsiflexion       Ankle plantarflexion       Ankle inversion       Ankle eversion        (Blank rows = not tested)  GAIT: 04/13/2022 Start-up stiffness with first 1-2 steps, otherwise looks good.  Lateral lean noted with slow and faster paced gait.  This is being addressed with his home exercise program.  03/02/2022 Distance walked: 200 feet Assistive device utilized: None Level of assistance: Complete Independence Comments: L lateral lean and pain with single-leg WB on the L.  Less pain with increased speed but hip abductors strength needs to be improved.    TODAY'S TREATMENT: 04/13/2022 Standing lumbar/hip extension 5X 3 seconds Hip hike in door frame 2 sets of 5 for 5 seconds Hip hike and push (IR hip before push) 5X 3 seconds Single knee to chest stretch 4X 20 seconds Figure 4 stretch 4X 20 seconds  Functional Activities: Step-down off 4 inch step 10 Bil Step-up and over 8 inch step 10X each slow eccentrics Update and review long-term HEP  Neuromuscular re-education: Single leg slow march 10X 3 seconds Bil    PATIENT EDUCATION:  03/23/2022 Education details: HEP  progression Person educated: Patient Education method: Consulting civil engineer, Media planner, Verbal cues, and Handouts Education comprehension: verbalized understanding, returned demonstration, verbal cues required, and needs further education   HOME EXERCISE PROGRAM: Access Code: [email or web address] URL: [email or web address] Date: 04/13/2022 Prepared by: Vista Mink  Exercises - Single Knee to Chest Stretch  - 1-2 x daily - 7 x weekly - 1 sets - 4-5 reps - 20 seconds hold - Supine Figure 4 Piriformis Stretch  - 1-2 x daily - 7 x weekly - 1 sets - 4-5 reps - 20 seconds hold - Standing Lumbar Extension at Wall - Forearms  - 3-5 x daily - 7 x weekly - 1 sets - 5 reps - 3 seconds hold - Standing Hip Hiking  - 3-5 x daily - 2-7 x weekly - 1 sets - 5 reps - 5 seconds hold - Seated Straight Leg Heel Taps  - 1-2 x daily - 1 x weekly - 1-2 sets - 5-10 reps - Tandem  Stance  - 1 x daily - 1 x weekly - 1 sets - 5 reps - 20 second hold - Sidelying Hip Abduction  - 1 x daily - 1 x weekly - 1-2 sets - 10-15 reps - Supine Bridge  - 1 x daily - 1 x weekly - 1-2 sets - 10-15 reps - 2 hold - Runner's March  - 1 x daily - 2-7 x weekly - 1 sets - 10 reps - 3-5 seconds hold - Lateral Step Down  - 1 x daily - 2-3 x weekly - 1-2 sets - 10 reps   ASSESSMENT:  CLINICAL IMPRESSION: Jon Ramos has met, or is very close to meeting all long-term goals.  Due to excellent objective numbers, functional scores and because he has returned to normal activities, Jon Ramos would like to transition into independent rehabilitation.  His HEP was updated today and he knows the door is open for future visits or communication if requested.  OBJECTIVE IMPAIRMENTS Abnormal gait, decreased activity tolerance, decreased balance, decreased endurance, decreased knowledge of condition, decreased mobility, difficulty walking, decreased ROM, decreased strength, increased edema, impaired perceived functional ability, impaired flexibility, and pain.   ACTIVITY LIMITATIONS bending, sitting, standing, squatting, sleeping, stairs, and locomotion level  PARTICIPATION LIMITATIONS: driving, community activity, and occupation  PERSONAL FACTORS Bipolar, HTN, seizures are also affecting patient's functional outcome.   REHAB POTENTIAL: Good  CLINICAL DECISION MAKING: Stable/uncomplicated  EVALUATION COMPLEXITY:  Low   GOALS: Goals reviewed with patient? Yes  SHORT TERM GOALS: Target date: 03/30/2022  Jon Ramos will be independent with his week 1 HEP Baseline: Started 03/02/2022 Goal status: Met 03/16/2022  2.  Jon Ramos will be able to stand and walk for > 15 and 10 minutes (respectively) without pain > 2/10 on the VAS Baseline: 4/10 Goal status: Met 9/22/023   LONG TERM GOALS: Target date: 04/27/2022   Improve FOTO to 68  Goal status: Met 04/13/2022  2.  Jon Ramos will report Lt hip/groin pain of consistently 0-2/10 on the VAS Baseline: 2-4/10 Goal status: Met 03/23/2022  3.  Improve Bil LE flexibility for hip flexors to 90 degrees, hamstrings to 40 degrees and ER rotation to 40 degrees Goal status: Partially Met - 04/13/2022  4.  Improve Lt hip flexors and abductors strength to at least 4/5 MMT  Goal status: Met 04/13/2022  5.  Jon Ramos will be independent in his long-term HEP Baseline: Started 03/02/2022 Goal status:  Met 04/13/2022   PLAN: PT FREQUENCY: DC  PT DURATION: DC  PLANNED INTERVENTIONS: Therapeutic exercises, Therapeutic activity, Neuromuscular re-education, Balance training, Gait training, Patient/Family education, Self Care, Stair training, Cryotherapy, and Manual therapy  PLAN FOR NEXT SESSION: DC  Farley Ly PT, MPT 04/13/22  9:45 AM

## 2022-05-10 ENCOUNTER — Encounter: Payer: 59 | Admitting: Orthopedic Surgery

## 2023-05-28 ENCOUNTER — Telehealth (INDEPENDENT_AMBULATORY_CARE_PROVIDER_SITE_OTHER): Payer: Self-pay | Admitting: Otolaryngology

## 2023-05-28 NOTE — Telephone Encounter (Signed)
Called [employer] for [employer].  Verified patient's insurance eligibility.  Effective date is 05/26/2021.  Coverage is still active.  Ref #: [id number] 05/28/2023

## 2023-06-13 ENCOUNTER — Institutional Professional Consult (permissible substitution) (INDEPENDENT_AMBULATORY_CARE_PROVIDER_SITE_OTHER): Payer: 59

## 2023-07-04 ENCOUNTER — Other Ambulatory Visit (INDEPENDENT_AMBULATORY_CARE_PROVIDER_SITE_OTHER): Payer: Self-pay | Admitting: Physician Assistant

## 2023-07-04 DIAGNOSIS — H9190 Unspecified hearing loss, unspecified ear: Secondary | ICD-10-CM

## 2023-07-05 ENCOUNTER — Ambulatory Visit (INDEPENDENT_AMBULATORY_CARE_PROVIDER_SITE_OTHER): Payer: Managed Care, Other (non HMO) | Admitting: Physician Assistant

## 2023-07-05 ENCOUNTER — Encounter (INDEPENDENT_AMBULATORY_CARE_PROVIDER_SITE_OTHER): Payer: Self-pay

## 2023-07-05 ENCOUNTER — Ambulatory Visit (INDEPENDENT_AMBULATORY_CARE_PROVIDER_SITE_OTHER): Payer: Managed Care, Other (non HMO) | Admitting: Audiology

## 2023-07-05 DIAGNOSIS — H9190 Unspecified hearing loss, unspecified ear: Secondary | ICD-10-CM

## 2023-07-05 DIAGNOSIS — H903 Sensorineural hearing loss, bilateral: Secondary | ICD-10-CM | POA: Diagnosis not present

## 2023-07-05 DIAGNOSIS — H9319 Tinnitus, unspecified ear: Secondary | ICD-10-CM | POA: Diagnosis not present

## 2023-07-05 DIAGNOSIS — H9313 Tinnitus, bilateral: Secondary | ICD-10-CM

## 2023-07-05 NOTE — Progress Notes (Signed)
  [address] [place], [place] [address] [phone]  Audiological Evaluation    Name: Theadore Blunck     DOB:   03-09-1966      MRN:   [id number]                                                                                     Service Date: 07/05/2023     Accompanied by: unaccompanied   Patient comes today after Reyes Cohen, PA-C sent a referral for a hearing evaluation due to concerns with hearing loss.   Symptoms Yes Details  Hearing loss  [x]  Both ears, longstanding  Tinnitus  [x]  Does not keep him up at night, longstanding, comes from his head  Ear pain/ Ear infections  []  Recurrent ear infections as a child  Balance problems  []    Noise exposure  []    Previous ear surgeries  [x]  Reports has tubes placed as a child  Family history  [x]  Both parents developed hearing loss with age  Amplification  []    Other  []      Otoscopy: Right ear: Clear external ear canals and notable landmarks visualized on the tympanic membrane. Left ear:  Clear external ear canals and notable landmarks visualized on the tympanic membrane.  Tympanometry: Right ear: Type A- Normal external ear canal volume with normal middle ear pressure and tympanic membrane compliance Left ear: Type A- Normal external ear canal volume with normal middle ear pressure and tympanic membrane compliance     Pure tone Audiometry: Normal to moderately severe sensorineural hearing loss from 250 Hz - 8000 Hz, in both ears.    The hearing test results were completed under headphones and results are deemed to be of good reliability. Test technique:  conventional     Speech Audiometry: Right ear- Speech Reception Threshold (SRT) was obtained at 25 dBHL Left ear-Speech Reception Threshold (SRT) was obtained at 25 dBHL   Word Recognition Score Tested using NU-6 (MLV) Right ear: 92% was obtained at a presentation level of 70 dBHL with contralateral masking which is deemed as  excellent Left ear: 96%  was obtained at a presentation level of 70 dBHL with contralateral masking which is deemed as  excellent    Impression: There is not a significant difference in pure-tone thresholds between ears. There is not a significant difference in the word recognition score in between ears.    Recommendations: Follow up with ENT as scheduled for today. Return for a hearing evaluation if concerns with hearing changes arise or per MD recommendation. Consider various tinnitus strategies, including the use of a sound generator, and/or tinnitus retraining therapy.  Consider a communication needs assessment after medical clearance for hearing aids is obtained.   Lenvil Swaim MARIE LEROUX-MARTINEZ, AUD

## 2023-07-05 NOTE — Progress Notes (Signed)
 Dear Dr. Hughie, Here is my assessment for our mutual patient, Jon Ramos. Thank you for allowing me the opportunity to care for your patient. Please do not hesitate to contact me should you have any other questions. Sincerely, Jon Ramos  Otolaryngology Clinic Note Referring provider: Dr. Hughie HPI:  Jon Ramos is a 58 y.o. male kindly referred by Dr. Hughie    The patient presents today with complaints of hearing loss and tinnitus. He notes that over the last five years he has experienced decreased hearing in bilateral ears. He feels that the right may be worse then the left. He reports that his wife has noticed he doesn't hear some things when she and other speak to him. He also noticed he cannot hear the kitchen timer anymore. In addition to the hearing loss he has noticed ringing in bilateral ears, R>L. He describes it at high pitched and continuous. Worse in the evenings, but does not keep him up at night. He denies any associated dizziness. He reports that as a kid he had several sets of tympanostomy tubes due to recurrent ear infections. As an adult no ear infections. No history of any other head or neck surgery, trauma, excessive noise exposure. Family history if significant for both parents with hearing issues in their later years. He also notes that his hearing is better at times when he pops his ears. No clicking or popping on their own, some minimal issues with changes in elevations. No history of seasonal allergies.    H&N Surgery: tympanostomy tubes as a child.   Independent Review of Additional Tests or Records:   07/05/2023 Audiogram was independently reviewed and interpreted by me and it reveals Right ear: normal to moderately severe sensory hearing loss from 250 Hz-8000Hz ; 92% word interpretation at 70dB; type A tympanogram Left ear: normal to moderately severe sensory hearing loss from 250 Hz-8000Hz ; 96% word interpretation at 70dB; type A tympanogram  SNHL=  Sensorineural hearing loss    PMH/Meds/All/SocHx/FamHx/ROS:   Past Medical History:  Diagnosis Date   Bipolar 1 disorder ([facility])    Hypertension      Past Surgical History:  Procedure Laterality Date   TOTAL HIP ARTHROPLASTY Left 02/15/2022   Procedure: LEFT TOTAL HIP ARTHROPLASTY ANTERIOR APPROACH;  Surgeon: Jon Cordella Hamilton, MD;  Location: [facility] OR;  Service: Orthopedics;  Laterality: Left;   TYMPANOSTOMY TUBE PLACEMENT     as a child    No family history on file.   Social Connections: Not on file      Current Outpatient Medications:    acetaminophen  (TYLENOL ) 325 MG tablet, Take 2 tablets (650 mg total) by mouth every 6 (six) hours as needed for mild pain (pain score 1-3 or temp > 100.5)., Disp: 40 tablet, Rfl: 0   aspirin  ([employer] ASPIRIN  ADULT LOW DOSE) 81 MG chewable tablet, Chew 1 tablet (81 mg total) by mouth daily., Disp: 90 tablet, Rfl: 1   divalproex  (DEPAKOTE  ER) 250 MG 24 hr tablet, Take 5 tablets (1,250 mg total) by mouth at bedtime. (Patient taking differently: Take 250 mg by mouth at bedtime.), Disp: 450 tablet, Rfl: 0   divalproex  (DEPAKOTE  ER) 500 MG 24 hr tablet, Take 1,000 mg by mouth at bedtime., Disp: , Rfl:    lithium  carbonate (ESKALITH ) 450 MG CR tablet, Take 450 mg by mouth at bedtime., Disp: , Rfl:    methocarbamol  (ROBAXIN ) 500 MG tablet, Take 1 tablet (500 mg total) by mouth every 8 (eight) hours as needed for muscle  spasms., Disp: 30 tablet, Rfl: 0   naproxen  (NAPROSYN ) 250 MG tablet, TAKE 1 TABLET BY MOUTH TWICE A DAY WITH A MEAL, Disp: 60 tablet, Rfl: 0   oxyCODONE  (OXY IR/ROXICODONE ) 5 MG immediate release tablet, Take 1 tablet (5 mg total) by mouth every 4 (four) hours as needed for moderate pain (pain score 4-6)., Disp: 30 tablet, Rfl: 0   QUEtiapine  (SEROQUEL ) 200 MG tablet, Take 200 mg by mouth at bedtime., Disp: , Rfl:    QUEtiapine  (SEROQUEL ) 300 MG tablet, Take 1 tablet (300 mg total) by mouth at bedtime. (Patient not taking: Reported on 01/29/2022),  Disp: 90 tablet, Rfl: 0   zinc gluconate 50 MG tablet, Take 50 mg by mouth daily., Disp: , Rfl:    Physical Exam:   There were no vitals taken for this visit.  Pertinent Findings  CN II-XII intact Bilateral EAC clear and TM intact with well pneumatized middle ear spaces Weber 512: equal Rinne 512: AC > BC b/l  Anterior rhinoscopy: Septum midline No lesions of oral cavity/oropharynx; dentition wnl No obviously palpable neck masses/lymphadenopathy/thyromegaly No respiratory distress or stridor   Seprately Identifiable Procedures:  None  Impression & Plans:  Jon Ramos is a 58 y.o. male with the following   Hearing loss-  The patient presents today with bilateral SNHL. I would recommend he follow up for hearing aid evaluation given that this does seem to be affected his quality of life to some degree.   Tinnitus-   Uncertain if the improvement in hearing will improve the tinnitus. No alarming features of tinnitus, ok to watch and wait at this time. If he has any changes or this begins to cause quality of life issues I would like to see him back.  Pt verbalized understanding and agreement to today's plan.   - f/u PRN   Thank you for allowing me the opportunity to care for your patient. Please do not hesitate to contact me should you have any other questions.  Sincerely, Jon Ramos [facility] Phone: [phone] Fax: [phone]  07/05/2023, 10:31 AM

## 2023-07-08 ENCOUNTER — Institutional Professional Consult (permissible substitution) (INDEPENDENT_AMBULATORY_CARE_PROVIDER_SITE_OTHER): Payer: 59

## 2023-07-11 ENCOUNTER — Encounter: Payer: Self-pay | Admitting: Audiology
